# Patient Record
Sex: Female | Born: 1935 | Race: White | Hispanic: No | Smoking: Former smoker
Health system: Southern US, Community
[De-identification: ages and names within clinical notes are randomized; demographics above are authoritative.]

## PROBLEM LIST (undated history)

## (undated) DIAGNOSIS — C801 Malignant (primary) neoplasm, unspecified: Secondary | ICD-10-CM

## (undated) DIAGNOSIS — M1990 Unspecified osteoarthritis, unspecified site: Secondary | ICD-10-CM

## (undated) DIAGNOSIS — R0600 Dyspnea, unspecified: Secondary | ICD-10-CM

## (undated) DIAGNOSIS — K219 Gastro-esophageal reflux disease without esophagitis: Secondary | ICD-10-CM

## (undated) DIAGNOSIS — D649 Anemia, unspecified: Secondary | ICD-10-CM

## (undated) DIAGNOSIS — E039 Hypothyroidism, unspecified: Secondary | ICD-10-CM

## (undated) DIAGNOSIS — I10 Essential (primary) hypertension: Secondary | ICD-10-CM

## (undated) HISTORY — PX: EYE SURGERY: SHX253

## (undated) HISTORY — PX: THYROIDECTOMY, PARTIAL: SHX18

## (undated) HISTORY — PX: JOINT REPLACEMENT: SHX530

## (undated) HISTORY — PX: TONSILLECTOMY: SUR1361

## (undated) HISTORY — PX: APPENDECTOMY: SHX54

---

## 2018-02-14 ENCOUNTER — Other Ambulatory Visit: Payer: Self-pay | Admitting: General Surgery

## 2018-02-14 ENCOUNTER — Ambulatory Visit: Payer: Self-pay | Admitting: General Surgery

## 2018-02-14 DIAGNOSIS — Z170 Estrogen receptor positive status [ER+]: Principal | ICD-10-CM

## 2018-02-14 DIAGNOSIS — C50911 Malignant neoplasm of unspecified site of right female breast: Secondary | ICD-10-CM

## 2018-02-15 ENCOUNTER — Encounter: Payer: Self-pay | Admitting: Hematology and Oncology

## 2018-02-15 ENCOUNTER — Telehealth: Payer: Self-pay | Admitting: Hematology and Oncology

## 2018-02-15 NOTE — Telephone Encounter (Signed)
A new patient appt has been scheduled for the pt to see Dr. Lindi Adie on 2/24 at 1pm. Letter mailed.

## 2018-02-16 ENCOUNTER — Encounter: Payer: Self-pay | Admitting: Hematology and Oncology

## 2018-02-16 ENCOUNTER — Telehealth: Payer: Self-pay | Admitting: Hematology and Oncology

## 2018-02-16 NOTE — Telephone Encounter (Signed)
Received a new referral from Dr. Marlou Starks for new dx of breast cancer. Pt has been cld and scheduled to see Dr. Lindi Adie on 2/25 at 1pm. Pt aware of new appt w/Dr. Isidore Moos as weel on the same day. Letter mailed.

## 2018-02-22 NOTE — Pre-Procedure Instructions (Signed)
Dana Levine  02/22/2018     Your procedure is scheduled on March 06, 2018.  Report to [facility] Admitting at 06:00 A.M.  Call this number if you have problems the morning of surgery:  [phone]   Remember:  Do not eat or drink after midnight.  You may drink clear liquids until 05:00am the morning of surgery.  Clear liquids allowed are:  Water, Juice (non-citric and without pulp), Carbonated beverages, Clear Tea, Black Coffee only, Plain Jell-O only and Gatorade.    Take these medicines the morning of surgery with A SIP OF WATER : Atenolol (Tenormin) Levothyroxine (Synthroid)  7 days prior to surgery STOP taking any Aspirin (unless otherwise instructed by your surgeon), Aleve, Naproxen, Ibuprofen, Motrin, Advil, Goody's, BC's, all herbal medications, fish oil, and all vitamins.    Do not wear jewelry, make-up or nail polish.  Do not wear lotions, powders, or perfumes, or deodorant.  Do not shave 48 hours prior to surgery.   Do not bring valuables to the hospital.  [facility] is not responsible for any belongings or valuables.  Contacts, dentures or bridgework may not be worn into surgery.  Leave your suitcase in the car.  After surgery it may be brought to your room.  For patients admitted to the hospital, discharge time will be determined by your treatment team.  Patients discharged the day of surgery will not be allowed to drive home.   Special instructions:   [facility]- Preparing For Surgery  Before surgery, you can play an important role. Because skin is not sterile, your skin needs to be as free of germs as possible. You can reduce the number of germs on your skin by washing with CHG (chlorahexidine gluconate) Soap before surgery.  CHG is an antiseptic cleaner which kills germs and bonds with the skin to continue killing germs even after washing.    Oral Hygiene is also important to reduce your risk of infection.  Remember - BRUSH YOUR TEETH THE  MORNING OF SURGERY WITH YOUR REGULAR TOOTHPASTE  Please do not use if you have an allergy to CHG or antibacterial soaps. If your skin becomes reddened/irritated stop using the CHG.  Do not shave (including legs and underarms) for at least 48 hours prior to first CHG shower. It is OK to shave your face.  Please follow these instructions carefully.   1. Shower the NIGHT BEFORE SURGERY and the MORNING OF SURGERY with CHG.   2. If you chose to wash your hair, wash your hair first as usual with your normal shampoo.  3. After you shampoo, rinse your hair and body thoroughly to remove the shampoo.  4. Use CHG as you would any other liquid soap. You can apply CHG directly to the skin and wash gently with a scrungie or a clean washcloth.   5. Apply the CHG Soap to your body ONLY FROM THE NECK DOWN.  Do not use on open wounds or open sores. Avoid contact with your eyes, ears, mouth and genitals (private parts). Wash Face and genitals (private parts)  with your normal soap.  6. Wash thoroughly, paying special attention to the area where your surgery will be performed.  7. Thoroughly rinse your body with warm water from the neck down.  8. DO NOT shower/wash with your normal soap after using and rinsing off the CHG Soap.  9. Pat yourself dry with a CLEAN TOWEL.  10. Wear CLEAN PAJAMAS to bed the night before  surgery, wear comfortable clothes the morning of surgery  11. Place CLEAN SHEETS on your bed the night of your first shower and DO NOT SLEEP WITH PETS.    Day of Surgery:  Do not apply any deodorants/lotions.  Please wear clean clothes to the hospital/surgery center.   Remember to brush your teeth WITH YOUR REGULAR TOOTHPASTE.    Please read over the following fact sheets that you were given.

## 2018-02-23 ENCOUNTER — Encounter (HOSPITAL_COMMUNITY)
Admission: RE | Admit: 2018-02-23 | Discharge: 2018-02-23 | Disposition: A | Discharge status: Home / Self Care | Payer: Medicare Other | Source: Ambulatory Visit | Attending: General Surgery | Admitting: General Surgery

## 2018-02-23 ENCOUNTER — Other Ambulatory Visit: Payer: Self-pay

## 2018-02-23 ENCOUNTER — Encounter (HOSPITAL_COMMUNITY): Payer: Self-pay

## 2018-02-23 DIAGNOSIS — Z01812 Encounter for preprocedural laboratory examination: Secondary | ICD-10-CM | POA: Diagnosis not present

## 2018-02-23 HISTORY — DX: Unspecified osteoarthritis, unspecified site: M19.90

## 2018-02-23 HISTORY — DX: Malignant (primary) neoplasm, unspecified: C80.1

## 2018-02-23 HISTORY — DX: Dyspnea, unspecified: R06.00

## 2018-02-23 HISTORY — DX: Essential (primary) hypertension: I10

## 2018-02-23 HISTORY — DX: Anemia, unspecified: D64.9

## 2018-02-23 HISTORY — DX: Gastro-esophageal reflux disease without esophagitis: K21.9

## 2018-02-23 HISTORY — DX: Hypothyroidism, unspecified: E03.9

## 2018-02-23 LAB — CBC
HCT: 45.5 % (ref 36.0–46.0)
Hemoglobin: 14.7 g/dL (ref 12.0–15.0)
MCH: 31 pg (ref 26.0–34.0)
MCHC: 32.3 g/dL (ref 30.0–36.0)
MCV: 96 fL (ref 80.0–100.0)
Platelets: 248 10*3/uL (ref 150–400)
RBC: 4.74 MIL/uL (ref 3.87–5.11)
RDW: 12.3 % (ref 11.5–15.5)
WBC: 7.3 10*3/uL (ref 4.0–10.5)
nRBC: 0 % (ref 0.0–0.2)

## 2018-02-23 LAB — BASIC METABOLIC PANEL
ANION GAP: 11 (ref 5–15)
BUN: 22 mg/dL (ref 8–23)
CO2: 26 mmol/L (ref 22–32)
Calcium: 9.9 mg/dL (ref 8.9–10.3)
Chloride: 102 mmol/L (ref 98–111)
Creatinine, Ser: 0.9 mg/dL (ref 0.44–1.00)
GFR calc Af Amer: >60 mL/min (ref >60)
GFR calc non Af Amer: 58 mL/min — ABNORMAL LOW (ref >60)
Glucose, Bld: 101 mg/dL — ABNORMAL HIGH (ref 70–99)
Potassium: 3.9 mmol/L (ref 3.5–5.1)
Sodium: 139 mmol/L (ref 135–145)

## 2018-02-23 NOTE — Progress Notes (Signed)
PCP - Natalia Leatherwood, PA Cardiologist - Dr. Shirlee More  Chest x-ray - n/a EKG - 11/18/2017, requested tracing Stress Test - 10/2017; requested results ECHO - 10/2017; requested results Cardiac Cath - n/a  Sleep Study - n/a CPAP - n/a  Fasting Blood Sugar - n/a Checks Blood Sugar _____ times a day  Blood Thinner Instructions: n/a Aspirin Instructions: n/a   Anesthesia review: recent cardiac testing, results requested  Patient denies shortness of breath, fever, cough and chest pain at PAT appointment   Patient verbalized understanding of instructions that were given to them at the PAT appointment. Patient was also instructed that they will need to review over the PAT instructions again at home before surgery.

## 2018-02-26 NOTE — Progress Notes (Addendum)
Anesthesia Chart Review:  Case:  [id number] Date/Time:  03/06/18 0745   Procedure:  RIGHT BREAST LUMPECTOMY WITH RADIOACTIVE SEED LOCALIZATION (Right Breast)   Anesthesia type:  General   Pre-op diagnosis:  RIGHT BREAST CANCER   Location:  [facility] OR ROOM 09 / [place] OR   Surgeon:  Jovita Kussmaul, MD     Radioactive seed implant scheduled for 03/03/18.   DISCUSSION: Patient is an 85 year old female scheduled for the above procedure.  History includes former smoker (quit 1970), HTN, hypothyroidism, anemia, GERD, right breast cancer, exertional dyspnea (work-up 10/2017, see below). Care Everywhere notes also mention history of "blood clots in her legs" after 2013 hip surgery.  She was seen by cardiologist Dr. Zenia Resides in 10/2017 for evaluation of dyspnea (see [place]). He reviewed 09/2017 echo results (EF 65%, mild MR, moderate TR, moderate pulmonary hypertension). CTA chest and stress echo ordered (see below). According to follow-up visit, D-dimer was elevated, but CTA showed no PE or other acute findings and stress echo showed normal LVEF without ischemic changes. No additional testing ordered.  She did report occasional "slight skipped beat/palpitations" that were short-lived and sometimes at night that were not particularly bothersome, but denied chest pain, dizziness, syncope. Taking atenolol at bedtime suggested. According to 02/21/18 cardiology visit with Chinita Greenland, NP patient was "currently stable from a cardiac perspective."  Based on currently available information, I would anticipate that she can proceed as planned if no acute changes.   VS: BP (!) 143/69 Comment: rechecked  Pulse 72   Temp 36.4 C   Resp 18   Ht 5\' 6"  (1.676 m)   Wt 65.5 kg   SpO2 96%   BMI 23.32 kg/m     PROVIDERS: - PCP is with [facility]. Last visit 12/01/17 for Medicare Wellness Exam by Natalia Leatherwood, PA-C - Celine Ahr, MD is cardiologist ([place]). Last visit  02/21/18 with Chinita Greenland, NP for follow-up HTN and dyspnea. Recent CTA and stress echo reassuring. Continue current antihypertensive regimen recommended as well as heart healthy diet and routine exercise.    LABS: Labs reviewed: Acceptable for surgery. (all labs ordered are listed, but only abnormal results are displayed)  Labs Reviewed  BASIC METABOLIC PANEL - Abnormal; Notable for the following components:      Result Value   Glucose, Bld 101 (*)    GFR calc non Af Amer 58 (*)    All other components within normal limits  CBC     IMAGES: CTA Chest 11/21/17 [facility]): Impression: 1.  No evidence of pulmonary embolism. 2.  Extensive atherosclerotic calcifications noted throughout the thoracic aorta.  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  The great vessel origins are widely patent.  The aorta measures 2.5 cm in maximum transverse dimension.  The descending thoracic aorta measures 2.1 cm and at the level of the diaphragm the aorta measures 1.6 cm. No evidence of aortic aneurysm or dissection. The root of the abdominal aorta measures a maximum of 3 cm in AP dimension.   EKG: 11/18/17 ([facility]): Sinus bradycardia at 54 bpm. Right BBB.   CV: Stress echo 11/21/17 ([facility]): Summary: No regional wall motion abnormality at rest or with post exercise views of the left ventricle.  No myocardial ischemia noted at a workload of 7 METS.  LVEF 70 to 75%. - Echo portion: Left  ventricle is normal in size.  There is no thrombus.  There is normal left ventricular wall thickness.  The left ventricular ejection fraction is normal at 70-75%.  The left ventricular wall motion is normal.  The E/A ratio is consistent with normal flow pattern. The right ventricle is mildly dilated.  The left and right atria are mildly dilated.  The mitral valve leaflets  appear normal.  There is no evidence of stenosis, fluttering, or prolapse. There is no mitral regurgitation.  The tricuspid valve leaflets are thin and pliable and the valve motion is normal.  The aortic valve opens well.  There is no aortic stenosis.  There is no aortic regurgitation.  The pulmonic valve is not well-visualized.  The aortic root is normal in diameter.  There is no pericardial effusion..  Echo 10/21/17 [facility]): Impression: 1. Preserved systolic function of 07%.  2. Trace aortic insufficiency.  3. Mild mitral regurgitation. 4. Moderate tricuspid regurgitation. 5. Moderate pulmonary hypertension. RVSP 50 mmHg. 6. Mild pulmonic insufficiency.    Past Medical History:  Diagnosis Date  . Anemia   . Arthritis   . Cancer [facility])    Right Breast Cancer  . Dyspnea    with exertion  . GERD (gastroesophageal reflux disease)   . Hypertension   . Hypothyroidism     Past Surgical History:  Procedure Laterality Date  . APPENDECTOMY    . EYE SURGERY Bilateral    cataracts removed  . JOINT REPLACEMENT Right    hip   . THYROIDECTOMY, PARTIAL    . TONSILLECTOMY      MEDICATIONS: . atenolol (TENORMIN) 50 MG tablet  . Coenzyme Q10 (CO Q10) 100 MG CAPS  . hydrochlorothiazide (HYDRODIURIL) 25 MG tablet  . levothyroxine (SYNTHROID, LEVOTHROID) 75 MCG tablet  . losartan (COZAAR) 25 MG tablet  . Multiple Vitamin (MULTIVITAMIN WITH MINERALS) TABS tablet  . Omega-3 Fatty Acids (FISH OIL) 1200 MG CAPS  . TURMERIC PO  . Vitamin D-Vitamin K (VITAMIN K2-VITAMIN D3 PO)   No current facility-administered medications for this encounter.     Myra Gianotti, PA-C Surgical Short Stay/Anesthesiology [facility] Phone [phone] [facility] Phone [phone] 02/27/2018 4:18 PM

## 2018-02-27 NOTE — Anesthesia Preprocedure Evaluation (Addendum)
Anesthesia Evaluation  Patient identified by MRN, date of birth, ID band Patient awake    Reviewed: Allergy & Precautions, NPO status , Patient's Chart, lab work & pertinent test results  Airway Mallampati: II  TM Distance: <3 FB Neck ROM: Full    Dental no notable dental hx. (+) Teeth Intact, Dental Advisory Given   Pulmonary shortness of breath, former smoker,    Pulmonary exam normal breath sounds clear to auscultation       Cardiovascular Exercise Tolerance: Good hypertension, Pt. on medications Normal cardiovascular exam Rhythm:Regular Rate:Normal  Echo 11/21/17  Stress echo 11/21/17 ([facility]): Summary: No regional wall motion abnormality at rest or with post exercise views of the left ventricle.  No myocardial ischemia noted at a workload of 7 METS.  LVEF 70 to 75%.   Neuro/Psych negative neurological ROS  negative psych ROS   GI/Hepatic GERD  ,  Endo/Other  Hypothyroidism   Renal/GU      Musculoskeletal  (+) Arthritis ,   Abdominal   Peds  Hematology Hgb 14.7   Anesthesia Other Findings   Reproductive/Obstetrics                          Anesthesia Physical Anesthesia Plan  ASA: III  Anesthesia Plan: General   Post-op Pain Management:    Induction: Intravenous  PONV Risk Score and Plan: 3 and Treatment may vary due to age or medical condition, Ondansetron and Dexamethasone  Airway Management Planned: LMA  Additional Equipment:   Intra-op Plan:   Post-operative Plan:   Informed Consent: I have reviewed the patients History and Physical, chart, labs and discussed the procedure including the risks, benefits and alternatives for the proposed anesthesia with the patient or authorized representative who has indicated his/her understanding and acceptance.     Dental advisory given  Plan Discussed with:   Anesthesia Plan Comments: (PAT note written  02/27/2018 by Myra Gianotti, PA-C. )       Anesthesia Quick Evaluation

## 2018-03-03 ENCOUNTER — Ambulatory Visit
Admission: RE | Admit: 2018-03-03 | Discharge: 2018-03-03 | Disposition: A | Discharge status: Home / Self Care | Payer: Medicare Other | Source: Ambulatory Visit | Attending: General Surgery | Admitting: General Surgery

## 2018-03-03 DIAGNOSIS — C50911 Malignant neoplasm of unspecified site of right female breast: Secondary | ICD-10-CM

## 2018-03-03 DIAGNOSIS — Z170 Estrogen receptor positive status [ER+]: Principal | ICD-10-CM

## 2018-03-06 ENCOUNTER — Encounter (HOSPITAL_COMMUNITY)
Admission: RE | Disposition: A | Discharge status: Home / Self Care | Payer: Self-pay | Source: Ambulatory Visit | Attending: General Surgery

## 2018-03-06 ENCOUNTER — Ambulatory Visit (HOSPITAL_COMMUNITY): Payer: Medicare Other | Admitting: Anesthesiology

## 2018-03-06 ENCOUNTER — Ambulatory Visit (HOSPITAL_COMMUNITY): Payer: Medicare Other | Admitting: Vascular Surgery

## 2018-03-06 ENCOUNTER — Encounter (HOSPITAL_COMMUNITY): Payer: Self-pay | Admitting: Surgery

## 2018-03-06 ENCOUNTER — Ambulatory Visit
Admission: RE | Admit: 2018-03-06 | Discharge: 2018-03-06 | Disposition: A | Discharge status: Home / Self Care | Payer: Medicare Other | Source: Ambulatory Visit | Attending: General Surgery | Admitting: General Surgery

## 2018-03-06 ENCOUNTER — Ambulatory Visit (HOSPITAL_COMMUNITY)
Admission: RE | Admit: 2018-03-06 | Discharge: 2018-03-06 | Disposition: A | Discharge status: Home / Self Care | Payer: Medicare Other | Source: Ambulatory Visit | Attending: General Surgery | Admitting: General Surgery

## 2018-03-06 ENCOUNTER — Other Ambulatory Visit: Payer: Self-pay

## 2018-03-06 DIAGNOSIS — D0501 Lobular carcinoma in situ of right breast: Secondary | ICD-10-CM | POA: Diagnosis not present

## 2018-03-06 DIAGNOSIS — E039 Hypothyroidism, unspecified: Secondary | ICD-10-CM | POA: Diagnosis not present

## 2018-03-06 DIAGNOSIS — Z170 Estrogen receptor positive status [ER+]: Principal | ICD-10-CM

## 2018-03-06 DIAGNOSIS — Z79890 Hormone replacement therapy (postmenopausal): Secondary | ICD-10-CM | POA: Diagnosis not present

## 2018-03-06 DIAGNOSIS — D0511 Intraductal carcinoma in situ of right breast: Secondary | ICD-10-CM | POA: Diagnosis not present

## 2018-03-06 DIAGNOSIS — Z86711 Personal history of pulmonary embolism: Secondary | ICD-10-CM | POA: Diagnosis not present

## 2018-03-06 DIAGNOSIS — I10 Essential (primary) hypertension: Secondary | ICD-10-CM | POA: Diagnosis not present

## 2018-03-06 DIAGNOSIS — C50111 Malignant neoplasm of central portion of right female breast: Secondary | ICD-10-CM | POA: Diagnosis present

## 2018-03-06 DIAGNOSIS — C50911 Malignant neoplasm of unspecified site of right female breast: Secondary | ICD-10-CM

## 2018-03-06 DIAGNOSIS — Z87891 Personal history of nicotine dependence: Secondary | ICD-10-CM | POA: Insufficient documentation

## 2018-03-06 DIAGNOSIS — Z800 Family history of malignant neoplasm of digestive organs: Secondary | ICD-10-CM | POA: Diagnosis not present

## 2018-03-06 DIAGNOSIS — Z79899 Other long term (current) drug therapy: Secondary | ICD-10-CM | POA: Diagnosis not present

## 2018-03-06 HISTORY — PX: BREAST LUMPECTOMY WITH RADIOACTIVE SEED LOCALIZATION: SHX6424

## 2018-03-06 SURGERY — BREAST LUMPECTOMY WITH RADIOACTIVE SEED LOCALIZATION
Anesthesia: General | Site: Breast | Laterality: Right

## 2018-03-06 MED ORDER — HYDROCODONE-ACETAMINOPHEN 5-325 MG PO TABS: 1.0000 | ORAL_TABLET | Freq: Four times a day (QID) | ORAL | 0 refills | Status: DC | PRN

## 2018-03-06 MED ORDER — KETOROLAC TROMETHAMINE 15 MG/ML IJ SOLN: 15.0000 mg | Freq: Once | INTRAMUSCULAR | Status: DC | PRN

## 2018-03-06 MED ORDER — CHLORHEXIDINE GLUCONATE CLOTH 2 % EX PADS: 6.0000 | MEDICATED_PAD | Freq: Once | CUTANEOUS | Status: DC

## 2018-03-06 MED ORDER — DEXAMETHASONE SODIUM PHOSPHATE 10 MG/ML IJ SOLN
INTRAMUSCULAR | Status: DC | PRN
  Administered 2018-03-06: 4 mg via INTRAVENOUS

## 2018-03-06 MED ORDER — FENTANYL CITRATE (PF) 250 MCG/5ML IJ SOLN
INTRAMUSCULAR | Status: AC
  Filled 2018-03-06: qty 5

## 2018-03-06 MED ORDER — ONDANSETRON HCL 4 MG/2ML IJ SOLN
INTRAMUSCULAR | Status: DC | PRN
  Administered 2018-03-06: 4 mg via INTRAVENOUS

## 2018-03-06 MED ORDER — EPHEDRINE 5 MG/ML INJ
INTRAVENOUS | Status: AC
  Filled 2018-03-06: qty 10

## 2018-03-06 MED ORDER — CEFAZOLIN SODIUM-DEXTROSE 2-4 GM/100ML-% IV SOLN
2.0000 g | INTRAVENOUS | Status: AC
  Administered 2018-03-06: 2 g via INTRAVENOUS
  Filled 2018-03-06: qty 100

## 2018-03-06 MED ORDER — ONDANSETRON HCL 4 MG/2ML IJ SOLN
INTRAMUSCULAR | Status: AC
  Filled 2018-03-06: qty 2

## 2018-03-06 MED ORDER — LIDOCAINE 2% (20 MG/ML) 5 ML SYRINGE
INTRAMUSCULAR | Status: DC | PRN
  Administered 2018-03-06: 80 mg via INTRAVENOUS

## 2018-03-06 MED ORDER — PROPOFOL 10 MG/ML IV BOLUS
INTRAVENOUS | Status: AC
  Filled 2018-03-06: qty 40

## 2018-03-06 MED ORDER — LACTATED RINGERS IV SOLN
INTRAVENOUS | Status: DC
  Administered 2018-03-06: 07:00:00 via INTRAVENOUS

## 2018-03-06 MED ORDER — 0.9 % SODIUM CHLORIDE (POUR BTL) OPTIME
TOPICAL | Status: DC | PRN
  Administered 2018-03-06: 1000 mL

## 2018-03-06 MED ORDER — FENTANYL CITRATE (PF) 100 MCG/2ML IJ SOLN
INTRAMUSCULAR | Status: DC | PRN
  Administered 2018-03-06: 50 ug via INTRAVENOUS

## 2018-03-06 MED ORDER — EPHEDRINE SULFATE 50 MG/ML IJ SOLN
INTRAMUSCULAR | Status: DC | PRN
  Administered 2018-03-06: 10 mg via INTRAVENOUS
  Administered 2018-03-06 (×2): 5 mg via INTRAVENOUS
  Administered 2018-03-06 (×2): 10 mg via INTRAVENOUS

## 2018-03-06 MED ORDER — FENTANYL CITRATE (PF) 100 MCG/2ML IJ SOLN: 25.0000 ug | INTRAMUSCULAR | Status: DC | PRN

## 2018-03-06 MED ORDER — LIDOCAINE 2% (20 MG/ML) 5 ML SYRINGE
INTRAMUSCULAR | Status: AC
  Filled 2018-03-06: qty 5

## 2018-03-06 MED ORDER — GABAPENTIN 300 MG PO CAPS
300.0000 mg | ORAL_CAPSULE | ORAL | Status: AC
  Administered 2018-03-06: 300 mg via ORAL
  Filled 2018-03-06: qty 1

## 2018-03-06 MED ORDER — SUCCINYLCHOLINE CHLORIDE 200 MG/10ML IV SOSY
PREFILLED_SYRINGE | INTRAVENOUS | Status: AC
  Filled 2018-03-06: qty 10

## 2018-03-06 MED ORDER — BUPIVACAINE HCL (PF) 0.25 % IJ SOLN
INTRAMUSCULAR | Status: DC | PRN
  Administered 2018-03-06: 20 mL

## 2018-03-06 MED ORDER — DEXAMETHASONE SODIUM PHOSPHATE 10 MG/ML IJ SOLN
INTRAMUSCULAR | Status: AC
  Filled 2018-03-06: qty 1

## 2018-03-06 MED ORDER — ONDANSETRON HCL 4 MG/2ML IJ SOLN: 4.0000 mg | Freq: Once | INTRAMUSCULAR | Status: DC | PRN

## 2018-03-06 MED ORDER — PHENYLEPHRINE 40 MCG/ML (10ML) SYRINGE FOR IV PUSH (FOR BLOOD PRESSURE SUPPORT)
PREFILLED_SYRINGE | INTRAVENOUS | Status: AC
  Filled 2018-03-06: qty 10

## 2018-03-06 MED ORDER — ACETAMINOPHEN 500 MG PO TABS
1000.0000 mg | ORAL_TABLET | ORAL | Status: AC
  Administered 2018-03-06: 1000 mg via ORAL
  Filled 2018-03-06: qty 2

## 2018-03-06 MED ORDER — PROPOFOL 10 MG/ML IV BOLUS
INTRAVENOUS | Status: DC | PRN
  Administered 2018-03-06: 80 mg via INTRAVENOUS

## 2018-03-06 MED ORDER — CELECOXIB 200 MG PO CAPS
200.0000 mg | ORAL_CAPSULE | ORAL | Status: AC
  Administered 2018-03-06: 200 mg via ORAL
  Filled 2018-03-06: qty 1

## 2018-03-06 MED ORDER — BUPIVACAINE HCL (PF) 0.25 % IJ SOLN
INTRAMUSCULAR | Status: AC
  Filled 2018-03-06: qty 30

## 2018-03-06 SURGICAL SUPPLY — 31 items
APPLIER CLIP 9.375 MED OPEN (MISCELLANEOUS)
BINDER BREAST LRG (GAUZE/BANDAGES/DRESSINGS) IMPLANT
BINDER BREAST XLRG (GAUZE/BANDAGES/DRESSINGS) IMPLANT
CANISTER SUCT 3000ML PPV (MISCELLANEOUS) ×3 IMPLANT
CHLORAPREP W/TINT 26ML (MISCELLANEOUS) ×3 IMPLANT
CLIP APPLIE 9.375 MED OPEN (MISCELLANEOUS) IMPLANT
COVER PROBE W GEL 5X96 (DRAPES) ×3 IMPLANT
COVER SURGICAL LIGHT HANDLE (MISCELLANEOUS) ×3 IMPLANT
COVER WAND RF STERILE (DRAPES) IMPLANT
DERMABOND ADVANCED (GAUZE/BANDAGES/DRESSINGS) ×2
DERMABOND ADVANCED .7 DNX12 (GAUZE/BANDAGES/DRESSINGS) ×1 IMPLANT
DEVICE DUBIN SPECIMEN MAMMOGRA (MISCELLANEOUS) ×3 IMPLANT
DRAPE CHEST BREAST 15X10 FENES (DRAPES) ×3 IMPLANT
ELECT COATED BLADE 2.86 ST (ELECTRODE) ×3 IMPLANT
ELECT REM PT RETURN 9FT ADLT (ELECTROSURGICAL) ×3
ELECTRODE REM PT RTRN 9FT ADLT (ELECTROSURGICAL) ×1 IMPLANT
GLOVE BIO SURGEON STRL SZ7.5 (GLOVE) ×6 IMPLANT
GOWN STRL REUS W/ TWL LRG LVL3 (GOWN DISPOSABLE) ×2 IMPLANT
GOWN STRL REUS W/TWL LRG LVL3 (GOWN DISPOSABLE) ×4
KIT BASIN OR (CUSTOM PROCEDURE TRAY) ×3 IMPLANT
KIT MARKER MARGIN INK (KITS) ×3 IMPLANT
LIGHT WAVEGUIDE WIDE FLAT (MISCELLANEOUS) IMPLANT
NEEDLE HYPO 25GX1X1/2 BEV (NEEDLE) ×3 IMPLANT
NS IRRIG 1000ML POUR BTL (IV SOLUTION) ×3 IMPLANT
PACK GENERAL/GYN (CUSTOM PROCEDURE TRAY) ×3 IMPLANT
SUT MNCRL AB 4-0 PS2 18 (SUTURE) ×3 IMPLANT
SUT SILK 2 0 SH (SUTURE) IMPLANT
SUT VIC AB 3-0 SH 18 (SUTURE) ×3 IMPLANT
SYR CONTROL 10ML LL (SYRINGE) ×3 IMPLANT
TOWEL OR 17X24 6PK STRL BLUE (TOWEL DISPOSABLE) ×3 IMPLANT
TOWEL OR 17X26 10 PK STRL BLUE (TOWEL DISPOSABLE) ×3 IMPLANT

## 2018-03-06 NOTE — Op Note (Signed)
03/06/2018  9:11 AM  PATIENT:  Dana Levine  85 y.o. female  PRE-OPERATIVE DIAGNOSIS:  RIGHT BREAST CANCER  POST-OPERATIVE DIAGNOSIS:  RIGHT BREAST CANCER  PROCEDURE:  Procedure(s): RIGHT BREAST LUMPECTOMY WITH RADIOACTIVE SEED LOCALIZATION (Right)  SURGEON:  Surgeon(s) and Role:    * Jovita Kussmaul, MD - Primary  PHYSICIAN ASSISTANT:   ASSISTANTS: none   ANESTHESIA:   local and general  EBL:  10 mL   BLOOD ADMINISTERED:none  DRAINS: none   LOCAL MEDICATIONS USED:  MARCAINE     SPECIMEN:  Source of Specimen:  right breast tissue with additional superior and lateral margins  DISPOSITION OF SPECIMEN:  PATHOLOGY  COUNTS:  YES  TOURNIQUET:  * No tourniquets in log *  DICTATION: .Dragon Dictation   After informed consent was obtained the patient was brought to the operating room and placed in the supine position on the operating table.  After adequate induction of general anesthesia the patient's right breast was prepped with ChloraPrep, allowed to dry, and draped in usual sterile manner.  An appropriate timeout was performed.  Previously an I-125 seed was placed in the upper inner portion of the right breast to mark an area of invasive breast cancer.  The neoprobe was set to I-125 in the area of radioactivity was readily identified centrally in the upper outer quadrant of the right breast.  The area around this was infiltrated with quarter percent Marcaine.  A small curvilinear incision was made along the upper inner edge of the areola of the right breast.  The incision was carried through the skin and subcutaneous tissue sharply with the electrocautery.  Dissection was then carried towards the radioactive seed sharply with the electrocautery under the direction of the neoprobe.  Once I more closely approached the radioactive seed I then removed a circular portion of breast tissue sharply around the radioactive seed while checking the area of radioactivity frequently.  During  the dissection I did encounter the seed along the upper edge of the specimen.  The seed was imaged and sent separately to pathology.  Once the specimen was removed it was oriented with the appropriate paint colors.  A specimen radiograph showed the clip to be near the upper edge of the specimen.  Because of this I decided to take an additional superior and lateral margins and these were marked appropriately.  Hemostasis was achieved using the Bovie electrocautery.  The wound was then irrigated with saline and infiltrated with more quarter percent Marcaine.  The cavity was marked with clips.  The deep layer of the wound was then closed with layers of interrupted 3-0 Vicryl stitches.  The skin was then closed with interrupted 4-0 Monocryl subcuticular stitches.  Dermabond dressings were applied.  The patient tolerated the procedure well.  At the end of the case all needle sponge and instrument counts were correct.  The patient was then awakened and taken recovery in stable condition.  PLAN OF CARE: Discharge to home after PACU  PATIENT DISPOSITION:  PACU - hemodynamically stable.   Delay start of Pharmacological VTE agent (>24hrs) due to surgical blood loss or risk of bleeding: not applicable

## 2018-03-06 NOTE — Transfer of Care (Signed)
Immediate Anesthesia Transfer of Care Note  Patient: Dana Levine  Procedure(s) Performed: RIGHT BREAST LUMPECTOMY WITH RADIOACTIVE SEED LOCALIZATION (Right Breast)  Patient Location: PACU  Anesthesia Type:General  Level of Consciousness: awake, drowsy and patient cooperative  Airway & Oxygen Therapy: Patient Spontanous Breathing and Patient connected to nasal cannula oxygen  Post-op Assessment: Report given to RN, Post -op Vital signs reviewed and stable and Patient moving all extremities  Post vital signs: Reviewed and stable  Last Vitals:  Vitals Value Taken Time  BP    Temp    Pulse    Resp    SpO2      Last Pain:  Vitals:   03/06/18 0654  TempSrc:   PainSc: 0-No pain      Patients Stated Pain Goal: 3 (13/68/59 9234)  Complications: No apparent anesthesia complications

## 2018-03-06 NOTE — Interval H&P Note (Signed)
History and Physical Interval Note:  03/06/2018 7:59 AM  Dana Levine  has presented today for surgery, with the diagnosis of RIGHT BREAST CANCER  The various methods of treatment have been discussed with the patient and family. After consideration of risks, benefits and other options for treatment, the patient has consented to  Procedure(s): RIGHT BREAST LUMPECTOMY WITH RADIOACTIVE SEED LOCALIZATION (Right) as a surgical intervention .  The patient's history has been reviewed, patient examined, no change in status, stable for surgery.  I have reviewed the patient's chart and labs.  Questions were answered to the patient's satisfaction.     Autumn Messing III

## 2018-03-06 NOTE — Anesthesia Procedure Notes (Signed)
Procedure Name: LMA Insertion Date/Time: 03/06/2018 8:21 AM Performed by: Lowella Dell, CRNA Pre-anesthesia Checklist: Patient identified, Emergency Drugs available, Suction available and Patient being monitored Patient Re-evaluated:Patient Re-evaluated prior to induction Oxygen Delivery Method: Circle System Utilized Preoxygenation: Pre-oxygenation with 100% oxygen Induction Type: IV induction Ventilation: Mask ventilation without difficulty LMA: LMA inserted LMA Size: 4.0 Number of attempts: 1 Placement Confirmation: positive ETCO2 and breath sounds checked- equal and bilateral Tube secured with: Tape Dental Injury: Teeth and Oropharynx as per pre-operative assessment

## 2018-03-06 NOTE — H&P (Signed)
Dana Levine  Location: [facility] Surgery Patient #: [id number] DOB: 1929-12-25 Married / Language: Undefined / Race: Refused to Report/Unreported Female   History of Present Illness  The patient is a 85 year old female who presents with breast cancer. We are asked to see the patient in consultation by Dr. Carmin Richmond to evaluate her for a new right breast cancer. The patient is an 85 year old white female who recently went for a routine screening mammogram. At that time she was found to have a 9 mm mass in the central portion of the right breast. This was biopsied and came back as a grade 2 invasive ductal cancer that was ER/PR positive and likely HER-2 negative. She denies any breast pain or discharge from the nipple. She denies any family history of breast cancer. Her father did have colon cancer. She does not smoke.   Past Surgical History  Appendectomy  Breast Biopsy  Right. Cataract Surgery  Bilateral. Colon Polyp Removal - Colonoscopy  Hip Surgery  Right. Oral Surgery  Thyroid Surgery  Tonsillectomy   Diagnostic Studies History  Colonoscopy  5-10 years ago Mammogram  within last year Pap Smear  >5 years ago  Allergies No Known Drug Allergies  Medication History  Atenolol (50MG Tablet, Oral) Active. hydroCHLOROthiazide (25MG Tablet, Oral) Active. Levothyroxine Sodium (75MCG Tablet, Oral) Active. Losartan Potassium (25MG Tablet, Oral) Active. Medications Reconciled  Social History  Alcohol use  Occasional alcohol use. Caffeine use  Coffee, Tea. No drug use  Tobacco use  Former smoker.  Family History Colon Cancer  Father. Malignant Neoplasm Of Pancreas  Daughter. Migraine Headache  Daughter.  Pregnancy / Birth History Age at menarche  24 years. Age of menopause  75-62 Contraceptive History  Oral contraceptives. Gravida  1 Maternal age  56-30 Para  1  Other Problems  Arthritis  Back Pain  Gastroesophageal  Reflux Disease  High blood pressure  Pulmonary Embolism / Blood Clot in Legs  Thyroid Disease     Review of Systems  HEENT Present- Hearing Loss, Seasonal Allergies and Wears glasses/contact lenses. Not Present- Earache, Hoarseness, Nose Bleed, Oral Ulcers, Ringing in the Ears, Sinus Pain, Sore Throat, Visual Disturbances and Yellow Eyes. Breast Present- Breast Mass. Not Present- Breast Pain, Nipple Discharge and Skin Changes. Cardiovascular Present- Shortness of Breath. Not Present- Chest Pain, Difficulty Breathing Lying Down, Leg Cramps, Palpitations, Rapid Heart Rate and Swelling of Extremities. Gastrointestinal Present- Hemorrhoids. Not Present- Abdominal Pain, Bloating, Bloody Stool, Change in Bowel Habits, Chronic diarrhea, Constipation, Difficulty Swallowing, Excessive gas, Gets full quickly at meals, Indigestion, Nausea, Rectal Pain and Vomiting. Female Genitourinary Present- Urgency. Not Present- Frequency, Nocturia, Painful Urination and Pelvic Pain. Musculoskeletal Present- Back Pain, Joint Pain and Swelling of Extremities. Not Present- Joint Stiffness, Muscle Pain and Muscle Weakness. Psychiatric Not Present- Anxiety, Bipolar, Change in Sleep Pattern, Depression, Fearful and Frequent crying. Endocrine Not Present- Cold Intolerance, Excessive Hunger, Hair Changes, Heat Intolerance, Hot flashes and New Diabetes. Hematology Not Present- Blood Thinners, Easy Bruising, Excessive bleeding, Gland problems, HIV and Persistent Infections.  Vitals  Weight: 143.5 lb Height: 64in Body Surface Area: 1.7 m Body Mass Index: 24.63 kg/m  BP: 144/88 (Sitting, Left Arm, Standard)       Physical Exam  General Mental Status-Alert. General Appearance-Consistent with stated age. Hydration-Well hydrated. Voice-Normal.  Head and Neck Head-normocephalic, atraumatic with no lesions or palpable masses. Trachea-midline. Thyroid Gland Characteristics - normal size and  consistency.  Eye Eyeball - Bilateral-Extraocular movements intact. Sclera/Conjunctiva - Bilateral-No scleral  icterus.  Chest and Lung Exam Chest and lung exam reveals -quiet, even and easy respiratory effort with no use of accessory muscles and on auscultation, normal breath sounds, no adventitious sounds and normal vocal resonance. Inspection Chest Wall - Normal. Back - normal.  Breast Note: There is no palpable mass in either breast. There is a small mobile palpable lymph node in the left axilla. There is no palpable axillary, supraclavicular, or cervical lymphadenopathy on the right.   Cardiovascular Cardiovascular examination reveals -normal heart sounds, regular rate and rhythm with no murmurs and normal pedal pulses bilaterally.  Abdomen Inspection Inspection of the abdomen reveals - No Hernias. Skin - Scar - no surgical scars. Palpation/Percussion Palpation and Percussion of the abdomen reveal - Soft, Non Tender, No Rebound tenderness, No Rigidity (guarding) and No hepatosplenomegaly. Auscultation Auscultation of the abdomen reveals - Bowel sounds normal.  Neurologic Neurologic evaluation reveals -alert and oriented x 3 with no impairment of recent or remote memory. Mental Status-Normal.  Musculoskeletal Normal Exam - Left-Upper Extremity Strength Normal and Lower Extremity Strength Normal. Normal Exam - Right-Upper Extremity Strength Normal and Lower Extremity Strength Normal.  Lymphatic Head & Neck  General Head & Neck Lymphatics: Bilateral - Description - Normal. Axillary  General Axillary Region: Bilateral - Description - Normal. Tenderness - Non Tender. Femoral & Inguinal  Generalized Femoral & Inguinal Lymphatics: Bilateral - Description - Normal. Tenderness - Non Tender.    Assessment & Plan  MALIGNANT NEOPLASM OF CENTRAL PORTION OF RIGHT BREAST IN FEMALE, ESTROGEN RECEPTOR POSITIVE (C50.111) Impression: The patient appears to have a  small stage I cancer in the central portion of the right breast. I have talked to her in detail about the different options for treatment and at this point she favors breast conservation. I think this is a very reasonable way of treating her cancer. Given her age I do not think she will need a node evaluation. I will plan for a right breast radioactive seed localized lumpectomy. I will also make a referral stay to medical and radiation oncology so they can discuss adjuvant therapy. Current Plans Referred to Oncology, for evaluation and follow up (Oncology). Routine.

## 2018-03-06 NOTE — Anesthesia Postprocedure Evaluation (Signed)
Anesthesia Post Note  Patient: Dana Levine  Procedure(s) Performed: RIGHT BREAST LUMPECTOMY WITH RADIOACTIVE SEED LOCALIZATION (Right Breast)     Patient location during evaluation: PACU Anesthesia Type: General Level of consciousness: awake and alert Pain management: pain level controlled Vital Signs Assessment: post-procedure vital signs reviewed and stable Respiratory status: spontaneous breathing, nonlabored ventilation, respiratory function stable and patient connected to nasal cannula oxygen Cardiovascular status: blood pressure returned to baseline and stable Postop Assessment: no apparent nausea or vomiting Anesthetic complications: no    Last Vitals:  Vitals:   03/06/18 0954 03/06/18 0959  BP: 136/71 (!) 153/68  Pulse: (!) 50 (!) 55  Resp: 12 14  Temp:    SpO2: 99% 100%    Last Pain:  Vitals:   03/06/18 0917  TempSrc:   PainSc: [place] A Kemper Heupel

## 2018-03-07 ENCOUNTER — Encounter (HOSPITAL_COMMUNITY): Payer: Self-pay | Admitting: General Surgery

## 2018-03-14 NOTE — Progress Notes (Addendum)
Location of Breast Cancer: Right Breast  Histology per Pathology Report:  01/24/18 Biopsy completed Per Dr. Ethlyn Gallery note ER/PR +, likely Her 2 NEG  03/06/18 Diagnosis 1. Breast, lumpectomy, Right - DUCTAL CARCINOMA IN SITU, INTERMEDIATE NUCLEAR GRADE. SEE NOTE. - LOBULAR CARCINOMA IN SITU. - BACKGROUND BREAST PARENCHYMA WITH FIBROCYSTIC CHANGE, INCLUDING SCLEROSING ADENOSIS AND CALCIFICATIONS. - PREVIOUS PROCEDURE-RELATED CHANGES. - SEE ONCOLOGY TABLE. 2. Medical device, removal - GROSS DIAGNOSIS ONLY: MEDICAL DEVICE CONSISTENT WITH LOCALIZATION SEED. 3. Breast, excision, Right superior - BENIGN BREAST PARENCHYMA WITH NO SPECIFIC HISTOPATHOLOGIC CHANGES. - NEGATIVE FOR IN SITU OR INVASIVE DUCTAL CARCINOMA. 4. Breast, excision, Right lateral margin - LOBULAR CARCINOMA IN SITU. - NEGATIVE FOR IN SITU OR INVASIVE DUCTAL CARCINOMA.   Did patient present with symptoms or was this found on screening mammography?: It was found on a screening mammogram.   Past/Anticipated interventions by surgeon, if any: 03/06/18 PROCEDURE:  Procedure(s): RIGHT BREAST LUMPECTOMY WITH RADIOACTIVE SEED LOCALIZATION (Right) SURGEON:  Surgeon(s) and Role:    Jovita Kussmaul, MD - Primary  Past/Anticipated interventions by medical oncology, if any:  Dr. Lindi Adie at 1:00 today  Lymphedema issues, if any:  She denies. She has good arm mobility.   Pain issues, if any:  She denies.   SAFETY ISSUES:  Prior radiation? No  Pacemaker/ICD? No  Possible current pregnancy? No  Is the patient on methotrexate? No  Current Complaints / other details:      BP (!) 151/70 (BP Location: Right Arm, Patient Position: Sitting)   Pulse 62   Temp 98.1 F (36.7 C) (Oral)   Resp 18   Ht 5\' 6"  (1.676 m)   Wt 143 lb 6.4 oz (65 kg)   SpO2 98%   BMI 23.15 kg/m    Wt Readings from Last 3 Encounters:  03/21/18 143 lb 6.4 oz (65 kg)  02/23/18 144 lb 8 oz (65.5 kg)   Jaeger Trueheart, Stephani Police, RN 03/14/2018,11:04  AM

## 2018-03-15 ENCOUNTER — Encounter: Payer: Self-pay | Admitting: Adult Health

## 2018-03-15 DIAGNOSIS — C50111 Malignant neoplasm of central portion of right female breast: Secondary | ICD-10-CM | POA: Insufficient documentation

## 2018-03-15 DIAGNOSIS — Z170 Estrogen receptor positive status [ER+]: Secondary | ICD-10-CM

## 2018-03-17 ENCOUNTER — Other Ambulatory Visit: Payer: Self-pay | Admitting: General Surgery

## 2018-03-17 NOTE — Progress Notes (Signed)
[facility] CONSULT NOTE  Patient Care Team: System, Pcp Not In as PCP - General  CHIEF COMPLAINTS/PURPOSE OF CONSULTATION: Newly diagnosed breast cancer  HISTORY OF PRESENTING ILLNESS:  Dana Levine 85 y.o. 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 The cancer was not palpable prior to diagnosis and was detected on a diagnostic mammogram on 12/28/17 which showed a suspicious 63m mass in the right breast. A biopsy on 01/24/18 showed grade 1 IDC with intermediate grade DCIS measuring 655min the right breast, ER 95%, PR 80%, HER2 positive. She had a right lumpectomy on 03/06/18 for which pathology showed intermediate grade DCIS with clear margins and lobular carcinoma in situ with no residual invasive carcinoma.   She presents to the clinic today with her husband and daughter. She denies any family history of breast cancer but notes colon cancer in her father and pancreatic cancer in her daughter. She met with Dr. SqIsidore Mooshis morning and is still deciding on whether or not to undergo radiation therapy and oral anti-estrogen therapy. She currently takes a Vitamin D supplement. She reviewed her medication list with me.   I reviewed her records extensively and collaborated the history with the patient.  SUMMARY OF ONCOLOGIC HISTORY:   Malignant neoplasm of central portion of right breast in female, estrogen receptor positive ([facility]  03/[facility] Surgery    Right lumpectomy: DCIS 0.3 cm cribriform type intermediate grade, LCIS, fibrocystic change, sclerosing adenosis, no evidence of invasive ductal carcinoma, margins negative, ER 95%, PR 80%, HER-2 equivocal 2+ by IHC ([facility] and not repeated on the final), T1BN0 stage Ia (based on biopsy showing invasive cancer)    03/15/2018 Initial Diagnosis    Screening mammogram detected 9 mm right breast cancer central portion  of the right breast, biopsy revealed grade 1 IDC WITH DCIS ER PR [facility] T1BN0 stage Ia clinical stage     MEDICAL HISTORY:  Past Medical History:  Diagnosis Date  . Anemia   . Arthritis   . [facility]   Right Breast Cancer  . Dyspnea    with exertion  . GERD (gastroesophageal reflux disease)   . Hypertension   . Hypothyroidism     SURGICAL HISTORY: Past Surgical History:  Procedure Laterality Date  . APPENDECTOMY    . BREAST LUMPECTOMY WITH RADIOACTIVE SEED LOCALIZATION Right 03/06/2018   Procedure: RIGHT BREAST LUMPECTOMY WITH RADIOACTIVE SEED LOCALIZATION;  Surgeon: ToJovita KussmaulMD;  [facility]: [facility];  Laterality: Right;  . EYE SURGERY Bilateral    cataracts removed  . JOINT REPLACEMENT Right    hip   . THYROIDECTOMY, PARTIAL    . TONSILLECTOMY      SOCIAL HISTORY: Social History   Socioeconomic History  . Marital status: Married    Spouse name: Not on file  . Number of children: Not on file  . Years of education: Not on file  . Highest education level: Not on file  Occupational History  . Not on file  Social Needs  . Financial resource strain: Not on file  . Food insecurity:    Worry: Not on file    Inability: Not on file  . Transportation needs:    Medical: No    Non-medical: No  Tobacco Use  . Smoking status: Former Smoker  Last attempt to quit: 1970    Years since quitting: 50.1  . Smokeless tobacco: Never Used  Substance and Sexual Activity  . Alcohol use: Yes    Alcohol/week: 1.0 standard drinks    Types: 1 Glasses of wine per week  . Drug use: Never  . Sexual activity: Not on file  Lifestyle  . Physical activity:    Days per week: Not on file    Minutes per session: Not on file  . Stress: Not on file  Relationships  . Social connections:    Talks on phone: Not on file    Gets together: Not on file    Attends religious service: Not on file    Active member  of club or organization: Not on file    Attends meetings of clubs or organizations: Not on file    Relationship status: Not on file  . Intimate partner violence:    Fear of current or ex partner: No    Emotionally abused: No    Physically abused: No    Forced sexual activity: No  Other Topics Concern  . Not on file  Social History Narrative  . Not on file    FAMILY HISTORY: Family History  Problem Relation Age of Onset  . Colon cancer Father   . Pancreatic cancer Daughter     ALLERGIES:  has No Known Allergies.  MEDICATIONS:  Current Outpatient Medications  Medication Sig Dispense Refill  . atenolol (TENORMIN) 50 MG tablet Take 50 mg by mouth daily.    . Coenzyme Q10 (CO Q10) 100 MG CAPS Take 100 mg by mouth daily.    . hydrochlorothiazide (HYDRODIURIL) 25 MG tablet Take 25 mg by mouth daily.    Marland Kitchen levothyroxine (SYNTHROID, LEVOTHROID) 75 MCG tablet Take 75 mcg by mouth daily before breakfast.    . losartan (COZAAR) 25 MG tablet Take 25 mg by mouth daily.    . Multiple Vitamin (MULTIVITAMIN WITH MINERALS) TABS tablet Take 1 tablet by mouth daily.    . Omega-3 Fatty Acids (FISH OIL) 1200 MG CAPS Take 1,200 mg by mouth daily.    . TURMERIC PO Take 1 tablet by mouth daily.    . Vitamin D-Vitamin K (VITAMIN K2-VITAMIN D3 PO) Take 1 tablet by mouth daily.    Marland Kitchen anastrozole (ARIMIDEX) 1 MG tablet Take 1 tablet (1 mg total) by mouth daily. 30 tablet 0   No current facility-administered medications for this visit.     REVIEW OF SYSTEMS:   Constitutional: Denies fevers, chills or abnormal night sweats Eyes: Denies blurriness of vision, double vision or watery eyes Ears, nose, mouth, throat, and face: Denies mucositis or sore throat Respiratory: Denies cough, dyspnea or wheezes Cardiovascular: Denies palpitation, chest discomfort or lower extremity swelling Gastrointestinal:  Denies nausea, heartburn or change in bowel habits Skin: Denies abnormal skin rashes Lymphatics: Denies  new lymphadenopathy or easy bruising Neurological:Denies numbness, tingling or new weaknesses Behavioral/Psych: Mood is stable, no new changes  Breast: Denies any palpable lumps or discharge All other systems were reviewed with the patient and are negative.  PHYSICAL EXAMINATION: ECOG PERFORMANCE STATUS: 1 - Symptomatic but completely ambulatory  Vitals:   03/21/18 1259  BP: (!) 150/65  Pulse: 66  Resp: 17  Temp: 98 F (36.7 C)  SpO2: 99%   Filed Weights   03/21/18 1259  Weight: 143 lb 14.4 oz (65.3 kg)    GENERAL:alert, no distress and comfortable SKIN: skin color, texture, turgor are normal, no rashes or  significant lesions EYES: normal, conjunctiva are pink and non-injected, sclera clear OROPHARYNX:no exudate, no erythema and lips, buccal mucosa, and tongue normal  NECK: supple, thyroid normal size, non-tender, without nodularity LYMPH:  no palpable lymphadenopathy in the cervical, axillary or inguinal LUNGS: clear to auscultation and percussion with normal breathing effort HEART: regular rate & rhythm and no murmurs and no lower extremity edema ABDOMEN:abdomen soft, non-tender and normal bowel sounds Musculoskeletal:no cyanosis of digits and no clubbing  PSYCH: alert & oriented x 3 with fluent speech NEURO: no focal motor/sensory deficits  LABORATORY DATA:  I have reviewed the data as listed Lab Results  Component Value Date   WBC 7.3 02/23/2018   HGB 14.7 02/23/2018   HCT 45.5 02/23/2018   MCV 96.0 02/23/2018   PLT 248 02/23/2018   Lab Results  Component Value Date   NA 139 02/23/2018   K 3.9 02/23/2018   CL 102 02/23/2018   CO2 26 02/23/2018    RADIOGRAPHIC STUDIES: I have personally reviewed the radiological reports and agreed with the findings in the report.  ASSESSMENT AND PLAN:  Malignant neoplasm of central portion of right breast in female, estrogen receptor positive ([place]) 03/06/2018:Right lumpectomy: DCIS 0.3 cm cribriform type intermediate  grade, LCIS, fibrocystic change, sclerosing adenosis, no evidence of invasive ductal carcinoma, margins negative, ER 95%, PR 80%, HER-2 equivocal 2+ by IHC (these immunostains were done at [facility] on the biopsy and not repeated on the final), T1BN0 stage Ia (based on biopsy showing invasive cancer)  Pathology counseling: I discussed the final pathology report of the patient provided  a copy of this report. I discussed the margins as well as lymph node surgeries. We also discussed the final staging along with previously performed ER/PR and HER-2/neu testing.  Recommendation: 1.  +/- Adjuvant radiation therapy 2.  Followed by adjuvant antiestrogen therapy  Dr. Isidore Moos recommended that she needs either radiation or hormone therapy.  Patient is not able to make a decision.  We would like to give her a 30-day supply of anastrozole and see if she tolerates it.  She will then make a decision on radiation versus hormone therapy.  Did not recommend repeating immunostains.  Even if the small amount of invasive cancer was HER-2 positive it would not warrant systemic therapy and hence did not request FISH testing on the biopsy sample.  Further appointments based on her decision.  All questions were answered. The patient knows to call the clinic with any problems, questions or concerns.   Nicholas Lose, MD 03/21/2018   Julious Oka Dorshimer, am acting as scribe for Nicholas Lose, MD.  I have reviewed the above documentation for accuracy and completeness, and I agree with the above.

## 2018-03-20 ENCOUNTER — Ambulatory Visit: Payer: Self-pay | Admitting: Hematology and Oncology

## 2018-03-20 ENCOUNTER — Ambulatory Visit: Payer: Self-pay | Admitting: Radiation Oncology

## 2018-03-20 ENCOUNTER — Ambulatory Visit: Payer: Self-pay

## 2018-03-21 ENCOUNTER — Encounter: Payer: Self-pay | Admitting: *Deleted

## 2018-03-21 ENCOUNTER — Encounter: Payer: Self-pay | Admitting: Radiation Oncology

## 2018-03-21 ENCOUNTER — Other Ambulatory Visit: Payer: Self-pay

## 2018-03-21 ENCOUNTER — Ambulatory Visit
Admission: RE | Admit: 2018-03-21 | Discharge: 2018-03-21 | Disposition: A | Discharge status: Home / Self Care | Payer: Medicare Other | Source: Ambulatory Visit | Attending: Radiation Oncology | Admitting: Radiation Oncology

## 2018-03-21 ENCOUNTER — Inpatient Hospital Stay: Payer: Medicare Other | Attending: Hematology and Oncology | Admitting: Hematology and Oncology

## 2018-03-21 VITALS — BP 151/70 | HR 62 | Temp 98.1°F | Resp 18 | Ht 66.0 in | Wt 143.4 lb

## 2018-03-21 DIAGNOSIS — C50111 Malignant neoplasm of central portion of right female breast: Secondary | ICD-10-CM

## 2018-03-21 DIAGNOSIS — Z170 Estrogen receptor positive status [ER+]: Secondary | ICD-10-CM

## 2018-03-21 DIAGNOSIS — E039 Hypothyroidism, unspecified: Secondary | ICD-10-CM | POA: Diagnosis not present

## 2018-03-21 DIAGNOSIS — I10 Essential (primary) hypertension: Secondary | ICD-10-CM | POA: Diagnosis not present

## 2018-03-21 DIAGNOSIS — Z87891 Personal history of nicotine dependence: Secondary | ICD-10-CM | POA: Diagnosis not present

## 2018-03-21 DIAGNOSIS — Z79899 Other long term (current) drug therapy: Secondary | ICD-10-CM | POA: Diagnosis not present

## 2018-03-21 MED ORDER — ANASTROZOLE 1 MG PO TABS: 1.0000 mg | ORAL_TABLET | Freq: Every day | ORAL | 0 refills | Status: DC

## 2018-03-21 NOTE — Progress Notes (Signed)
Radiation Oncology         [phone] ________________________________  Initial outpatient Consultation  Name: Dana Levine MRN: [id number]  Date: 03/21/2018  DOB: 1929-12-05  [id number], Pcp Not In  Jovita Kussmaul, MD   REFERRING PHYSICIAN: Autumn Messing III, MD  DIAGNOSIS:    ICD-10-CM   1. Malignant neoplasm of central portion of right breast in female, estrogen receptor positive ([place]) C50.111    Z17.0    T52mc vs Tis N0 M0 Right breast cancer/DCIS ER+ / PR+, Grade 2  Cancer Staging Malignant neoplasm of central portion of right breast in female, estrogen receptor positive ([place] Staging form: Breast, AJCC 8th Edition - Pathologic: Stage 0 (pTis (DCIS), pN0, cM0) - Unsigned   CHIEF COMPLAINT: Here to discuss management of right breast DCIS and small focus of cancer on biopsy  HISTORY OF PRESENT ILLNESS::Dana LLadoris Lythgoeis a 85y.o. female who presented with breast abnormality on the following imaging: right breast mammogram with targeted ultrasound on the date of 12/28/2017.  Ultrasound revealed a 7 mm irregularly marginated solid nodule in the posterior central right breast.  Symptoms, if any, at that time, were: none.  [facility] showed invasive ductal carcinoma, 6 mm, with ductal carcinoma in situ, low to intermediate grade, solid and cribriform type with focal comedonecrosis.  ER status: 95%, strong; PR status: 80%, strong; Her2 status equivocal; Grade 2.  She proceeded to right breast lumpectomy on 03/06/2018 under Dr. TMarlou Starks Pathology from the procedure revealed: DCIS, intermediate nuclear grade, cribriform histology; lobular carcinoma in situ. Dr. TMarlou Starksrequested reevaluation of the patient's initial biopsy to confirm cancer on 03/17/2018. Results returned invasive ductal carcinoma, grade 1, and DCIS with calcifications (low to intermediate grade, solid and cribriform types).  She is scheduled to see Dr. GLindi Adiein consult later  today, 03/21/2018.  On review of systems, she denies lymphedema issues and any pain. She is accompanied by her husband and daughter. She is in excellent health. She plans to stay with her [place] if she receives [place]   PREVIOUS RADIATION THERAPY: No  PAST MEDICAL HISTORY:  has a past medical history of Anemia, Arthritis, Cancer ([place], Dyspnea, GERD (gastroesophageal reflux disease), Hypertension, and Hypothyroidism.    PAST SURGICAL HISTORY: Past Surgical History:  Procedure Laterality Date  . APPENDECTOMY    . BREAST LUMPECTOMY WITH RADIOACTIVE SEED LOCALIZATION Right 03/06/2018   Procedure: RIGHT BREAST LUMPECTOMY WITH RADIOACTIVE SEED LOCALIZATION;  Surgeon: TJovita Kussmaul MD;  [facility]  Service: General;  Laterality: Right;  . EYE SURGERY Bilateral    cataracts removed  . JOINT REPLACEMENT Right    hip   . THYROIDECTOMY, PARTIAL    . TONSILLECTOMY      FAMILY HISTORY: family history includes Colon cancer in her father; Pancreatic cancer in her daughter.  SOCIAL HISTORY:  reports that she quit smoking about 50 years ago. She has never used smokeless tobacco. She reports current alcohol use of about 1.0 standard drinks of alcohol per week. She reports that she does not use drugs.  ALLERGIES: Patient has no known allergies.  MEDICATIONS:  Current Outpatient Medications  Medication Sig Dispense Refill  . atenolol (TENORMIN) 50 MG tablet Take 50 mg by mouth daily.    . Coenzyme Q10 (CO Q10) 100 MG CAPS Take 100 mg by mouth daily.    . hydrochlorothiazide (HYDRODIURIL) 25 MG tablet Take 25 mg by mouth daily.    .Marland Kitchen  levothyroxine (SYNTHROID, LEVOTHROID) 75 MCG tablet Take 75 mcg by mouth daily before breakfast.    . losartan (COZAAR) 25 MG tablet Take 25 mg by mouth daily.    . Multiple Vitamin (MULTIVITAMIN WITH MINERALS) TABS tablet Take 1 tablet by mouth daily.    . Omega-3 Fatty Acids (FISH OIL) 1200 MG CAPS Take 1,200 mg by mouth daily.    . TURMERIC PO  Take 1 tablet by mouth daily.    . Vitamin D-Vitamin K (VITAMIN K2-VITAMIN D3 PO) Take 1 tablet by mouth daily.     No current facility-administered medications for this encounter.     REVIEW OF SYSTEMS: A 10+ POINT REVIEW OF SYSTEMS WAS OBTAINED including neurology, dermatology, psychiatry, cardiac, respiratory, lymph, extremities, GI, GU, Musculoskeletal, constitutional, breasts, reproductive, HEENT.  All pertinent positives are noted in the HPI.  All others are negative.    PHYSICAL EXAM:  height is 5' 6"  (1.676 m) and weight is 143 lb 6.4 oz (65 kg). Her oral temperature is 98.1 F (36.7 C). Her blood pressure is 151/70 (abnormal) and her pulse is 62. Her respiration is 18 and oxygen saturation is 98%.   General: Alert and oriented, in no acute distress HEENT: Head is normocephalic. Extraocular movements are intact. Oropharynx is clear. + hearing aid Neck: Neck is supple. She has some fullness in the thyroid region that is nonspecific. Heart: Regular in rate and rhythm with no murmurs, rubs, or gallops. Chest: Clear to auscultation bilaterally, with no rhonchi, wheezes, or rales. She has a scar in the upper chest from a partial thyroidectomy years ago.  Abdomen: Soft, nontender, nondistended, with no rigidity or guarding. Extremities: No cyanosis or edema. Lymphatics: see Neck Exam Skin: No concerning lesions. Musculoskeletal: symmetric strength and muscle tone throughout. Neurologic: Cranial nerves II through XII are grossly intact. No obvious focalities. Speech is fluent. Coordination is intact. Psychiatric: Judgment and insight are intact. Affect is appropriate. Breasts: She has a central lumpectomy scar on the right breast that is healing well. No other palpable masses appreciated in the breasts or axillae .   ECOG = 0  0 - Asymptomatic (Fully active, able to carry on all predisease activities without restriction)  1 - Symptomatic but completely ambulatory (Restricted in  physically strenuous activity but ambulatory and able to carry out work of a light or sedentary nature. For example, light housework, office work)  2 - Symptomatic, <50% in bed during the day (Ambulatory and capable of all self care but unable to carry out any work activities. Up and about more than 50% of waking hours)  3 - Symptomatic, >50% in bed, but not bedbound (Capable of only limited self-care, confined to bed or chair 50% or more of waking hours)  4 - Bedbound (Completely disabled. Cannot carry on any self-care. Totally confined to bed or chair)  5 - Death   Eustace Pen MM, Creech RH, Tormey DC, et al. [phone]). "Toxicity and response criteria of the [facility] Group". [place] Oncol. 5 (6): 649-55   LABORATORY DATA:  Lab Results  Component Value Date   WBC 7.3 02/23/2018   HGB 14.7 02/23/2018   HCT 45.5 02/23/2018   MCV 96.0 02/23/2018   PLT 248 02/23/2018   CMP     Component Value Date/Time   NA 139 02/23/2018 1357   K 3.9 02/23/2018 1357   CL 102 02/23/2018 1357   CO2 26 02/23/2018 1357   GLUCOSE 101 (H) 02/23/2018 1357   BUN 22  02/23/2018 1357   CREATININE 0.90 02/23/2018 1357   CALCIUM 9.9 02/23/2018 1357   GFRNONAA 58 (L) 02/23/2018 1357   GFRAA >60 02/23/2018 1357         RADIOGRAPHY: Mm Breast Surgical Specimen  Result Date: 03/06/2018 CLINICAL DATA:  Specimen radiograph status post right breast lumpectomy. EXAM: SPECIMEN RADIOGRAPH OF THE RIGHT BREAST COMPARISON:  Previous exam(s). FINDINGS: Status post excision of the right breast. The radioactive seed and biopsy marker clip are present, completely intact, and were marked for pathology. The seed is close to one margin of the specimen, and this was conveyed to the OR at 8:48 a.m. IMPRESSION: Specimen radiograph of the right breast. Electronically Signed   By: Ammie Ferrier M.D.   On: 03/06/2018 08:48   Mm Rt Radioactive Seed Loc Mammo Guide  Result Date: 03/03/2018 CLINICAL DATA:   85 year old female presenting for radioactive seed localization of the right breast prior to lumpectomy. EXAM: MAMMOGRAPHIC GUIDED RADIOACTIVE SEED LOCALIZATION OF THE RIGHT BREAST COMPARISON:  Previous exam(s). FINDINGS: Patient presents for radioactive seed localization prior to right breast lumpectomy. I met with the patient and we discussed the procedure of seed localization including benefits and alternatives. We discussed the high likelihood of a successful procedure. We discussed the risks of the procedure including infection, bleeding, tissue injury and further surgery. We discussed the low dose of radioactivity involved in the procedure. Informed, written consent was given. The usual time-out protocol was performed immediately prior to the procedure. Using mammographic guidance, sterile technique, 1% lidocaine and an I-125 radioactive seed, the cylinder shaped biopsy marking clip in the central right was localized using a superior approach. The follow-up mammogram images confirm the seed in the expected location and were marked for Dr. Marlou Starks. Follow-up survey of the patient confirms presence of the radioactive seed. Order number of I-125 seed:  [id number]. Total activity:  2.947 millicuries reference Date: 02/24/2018 The patient tolerated the procedure well and was released from the [facility]. She was given instructions regarding seed removal. IMPRESSION: Radioactive seed localization right breast. No apparent complications. Electronically Signed   By: Ammie Ferrier M.D.   On: 03/03/2018 13:56      IMPRESSION/PLAN: Right Breast DCIS   For the patient's early stage favorable risk breast cancer, we had a thorough discussion about her options for adjuvant therapy. One option would be antiestrogen therapy as discussed with medical oncology. She would take a pill for approximately 5 years. The alternative option would be radiotherapy to the breast. The most aggressive option would be to pursue both  modalities (but I told her I thought this would be more therapy than necessary).   Of note, I discussed the data from the [employer] al trial in the [employer]. She understands that tamoxifen compared to radiation plus tamoxifen demonstrated no survival benefit among the women in this study. The women were 21 years or older with stage I estrogen receptor positive breast cancer. Extrapolating from this study, I told Ms. Finnigan that her overall life expectancy should not be affected by adding radiotherapy to antiestrogen medication. She understands that the main benefit of radiotherapy would be a very small but measurable local control benefit (risk of local recurrence to be lowered from ~9% --> ~2%). With radiation alone, I would except the local recurrence risk to be between 2-9%.  We discussed the risks benefits and side effects of radiotherapy. She understands that the side effects would likely include some skin irritation and fatigue during  the weeks of radiation. There is a risk of late effects which include but are not necessarily limited to cosmetic changes and rare lung toxicity. I would anticipate delivering approximately 3 weeks of radiotherapy: I believe her anatomy is conducive to hypofractionation, she would probably receive 40.05 Gray in 15 fractions.  If she opts to proceed with radiation, she would prefer to be simmed in the second week or so of March and to begin treatments after 04/18/2018 due to prior plans.  She has my contact information and will call when she decides.    __________________________________________   Eppie Gibson, MD   This document serves as a record of services personally performed by Eppie Gibson, MD. It was created on her behalf by Wilburn Mylar, a trained medical scribe. The creation of this record is based on the scribe's personal observations and the provider's statements to them. This document has been checked and approved by the  attending provider.

## 2018-03-21 NOTE — Assessment & Plan Note (Signed)
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 I discussed the margins as well as lymph node surgeries. We also discussed the final staging along with previously performed ER/PR and HER-2/neu testing.  Recommendation: 1.  Adjuvant radiation therapy 2.  Followed by adjuvant antiestrogen therapy  Did not recommend repeating immunostains.  Even if the small amount of invasive cancer was HER-2 positive it would not warrant systemic therapy and hence did not request FISH testing on the biopsy sample.  Return to clinic at the end of radiation to discuss antiestrogen therapy. 

## 2018-03-22 ENCOUNTER — Telehealth: Payer: Self-pay | Admitting: Hematology and Oncology

## 2018-03-22 NOTE — Telephone Encounter (Signed)
No los °

## 2018-04-13 ENCOUNTER — Other Ambulatory Visit: Payer: Self-pay | Admitting: *Deleted

## 2018-04-13 MED ORDER — ANASTROZOLE 1 MG PO TABS: 1.0000 mg | ORAL_TABLET | Freq: Every day | ORAL | 3 refills | Status: DC

## 2018-04-13 NOTE — Telephone Encounter (Signed)
Received call from patient that she has decided to do anti-estrogen only and no radiation.  Called refill into her pharmacy and told her we would get her in for survivorship appointment in a few months.

## 2018-04-19 ENCOUNTER — Telehealth: Payer: Self-pay | Admitting: *Deleted

## 2018-04-19 NOTE — Telephone Encounter (Signed)
Late entry- Received call from patient last week 3/19 stating she has decided against radiation therapy and will take an ai.  Anastrozole called into her pharmacy and informed her she would be seen in our survivorship clinic when appropriate.  Encouraged her to call with any concerns or if she has any problems with the medication. Patient verbalized understanding.

## 2018-04-27 ENCOUNTER — Telehealth: Payer: Self-pay | Admitting: Adult Health

## 2018-04-27 NOTE — Telephone Encounter (Signed)
Scheduled per sch msg. Called and spoke with patient. Confirmed date and time ° °

## 2018-07-27 ENCOUNTER — Telehealth: Payer: Self-pay | Admitting: Adult Health

## 2018-07-27 NOTE — Telephone Encounter (Signed)
I left a message regarding video visit  °

## 2018-07-31 ENCOUNTER — Ambulatory Visit: Payer: Medicare Other | Admitting: Adult Health

## 2018-08-18 ENCOUNTER — Telehealth: Payer: Self-pay | Admitting: Adult Health

## 2018-08-18 NOTE — Telephone Encounter (Signed)
Confirmed appt and verified info. °

## 2018-08-21 ENCOUNTER — Other Ambulatory Visit: Payer: Self-pay | Admitting: Hematology and Oncology

## 2018-08-21 ENCOUNTER — Inpatient Hospital Stay: Payer: Medicare Other | Attending: Adult Health | Admitting: Adult Health

## 2018-08-21 ENCOUNTER — Encounter: Payer: Self-pay | Admitting: Adult Health

## 2018-08-21 DIAGNOSIS — Z87891 Personal history of nicotine dependence: Secondary | ICD-10-CM | POA: Diagnosis not present

## 2018-08-21 DIAGNOSIS — Z170 Estrogen receptor positive status [ER+]: Secondary | ICD-10-CM

## 2018-08-21 DIAGNOSIS — Z79811 Long term (current) use of aromatase inhibitors: Secondary | ICD-10-CM | POA: Diagnosis not present

## 2018-08-21 DIAGNOSIS — C50111 Malignant neoplasm of central portion of right female breast: Secondary | ICD-10-CM

## 2018-08-21 DIAGNOSIS — M810 Age-related osteoporosis without current pathological fracture: Secondary | ICD-10-CM

## 2018-08-21 NOTE — Progress Notes (Signed)
SURVIVORSHIP VIRTUAL VISIT:  I connected with Dana Levine on 08/21/18 at  2:00 PM EDT by my chart video and verified that I am speaking with the correct person using two identifiers.   I discussed the limitations, risks, security and privacy concerns of performing an evaluation and management service virtually and the availability of in person appointments. I also discussed with the patient that there may be a patient responsible charge related to this service. The patient expressed understanding and agreed to proceed.   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, LCIS, fibrocystic change, sclerosing adenosis, no evidence of invasive ductal carcinoma, margins negative, ER 95%, PR 80%, HER-2 equivocal 2+ by IHC (these immunostains were done at [facility] on the biopsy and not repeated on the final), T1BN0 stage Ia (based on biopsy showing invasive cancer)   03/15/2018 Initial Diagnosis   Screening mammogram detected 9 mm right breast cancer central portion of the right breast, biopsy revealed grade 1 IDC WITH DCIS ER PR positive HER-2 negative ([facility] biopsy), T1BN0 stage Ia clinical stage   03/2018 -  Anti-estrogen oral therapy   Anastrozole daily     INTERVAL HISTORY:  Dana Levine to review her survivorship care plan detailing her treatment course for breast cancer, as well as monitoring long-term side effects of that treatment, education regarding health maintenance, screening, and overall wellness and health promotion.     Overall, Dana Levine reports feeling quite well.  She is taking Anastrozole daily.  She denies any hot flashes, arthritis, or vaginal dryness.  She did have some stool changes that she saw GI for before the pandemic and wonders if she should follow up on that.  She also  notes mild fatigue.    REVIEW OF SYSTEMS:  Review of Systems  Constitutional: Negative for appetite change, chills, fatigue, fever and unexpected weight change.  HENT:   Negative for hearing loss, lump/mass, sore throat and trouble swallowing.   Eyes: Negative for eye problems and icterus.  Respiratory: Negative for chest tightness, cough and shortness of breath.   Cardiovascular: Negative for chest pain, leg swelling and palpitations.  Gastrointestinal: Negative for abdominal distention, abdominal pain, constipation, diarrhea, nausea and vomiting.  Endocrine: Negative for hot flashes.  Musculoskeletal: Negative for arthralgias.  Skin: Negative for itching and rash.  Neurological: Negative for dizziness, extremity weakness, headaches and numbness.  Hematological: Negative for adenopathy. Does not bruise/bleed easily.  Psychiatric/Behavioral: Negative for depression. The patient is not nervous/anxious.   Breast: Denies any new nodularity, masses, tenderness, nipple changes, or nipple discharge.      ONCOLOGY TREATMENT TEAM:  1. Surgeon:  Dr. Marlou Starks at [facility] 2. Medical Oncologist: Dr. Lindi Adie  3. Radiation Oncologist: Dr. Isidore Moos    PAST MEDICAL/SURGICAL HISTORY:  Past Medical History:  Diagnosis Date  . Anemia   . Arthritis   . Cancer [facility])    Right Breast Cancer  . Dyspnea    with exertion  . GERD (gastroesophageal reflux disease)   . Hypertension   . Hypothyroidism    Past Surgical History:  Procedure Laterality Date  . APPENDECTOMY    . BREAST LUMPECTOMY WITH RADIOACTIVE SEED LOCALIZATION Right 03/06/2018   Procedure: RIGHT BREAST LUMPECTOMY WITH RADIOACTIVE SEED LOCALIZATION;  Surgeon: Jovita Kussmaul, MD;  Location: [place];  Service: General;  Laterality: Right;  .  EYE SURGERY Bilateral    cataracts removed  . JOINT REPLACEMENT Right    hip   . THYROIDECTOMY, PARTIAL    . TONSILLECTOMY       ALLERGIES:  No Known Allergies   CURRENT  MEDICATIONS:  Outpatient Encounter Medications as of 08/21/2018  Medication Sig  . anastrozole (ARIMIDEX) 1 MG tablet Take 1 tablet (1 mg total) by mouth daily.  Marland Kitchen atenolol (TENORMIN) 50 MG tablet Take 50 mg by mouth daily.  . Coenzyme Q10 (CO Q10) 100 MG CAPS Take 100 mg by mouth daily.  . hydrochlorothiazide (HYDRODIURIL) 25 MG tablet Take 25 mg by mouth daily.  Marland Kitchen levothyroxine (SYNTHROID, LEVOTHROID) 75 MCG tablet Take 75 mcg by mouth daily before breakfast.  . losartan (COZAAR) 25 MG tablet Take 25 mg by mouth daily.  . Multiple Vitamin (MULTIVITAMIN WITH MINERALS) TABS tablet Take 1 tablet by mouth daily.  . Omega-3 Fatty Acids (FISH OIL) 1200 MG CAPS Take 1,200 mg by mouth daily.  . TURMERIC PO Take 1 tablet by mouth daily.  . Vitamin D-Vitamin K (VITAMIN K2-VITAMIN D3 PO) Take 1 tablet by mouth daily.   No facility-administered encounter medications on file as of 08/21/2018.      ONCOLOGIC FAMILY HISTORY:  Family History  Problem Relation Age of Onset  . Colon cancer Father   . Pancreatic cancer Daughter      GENETIC COUNSELING/TESTING: Not at this time  SOCIAL HISTORY:  Social History   Socioeconomic History  . Marital status: Married    Spouse name: Not on file  . Number of children: Not on file  . Years of education: Not on file  . Highest education level: Not on file  Occupational History  . Not on file  Social Needs  . Financial resource strain: Not on file  . Food insecurity    Worry: Not on file    Inability: Not on file  . Transportation needs    Medical: No    Non-medical: No  Tobacco Use  . Smoking status: Former Smoker    Quit date: 1970    Years since quitting: 50.6  . Smokeless tobacco: Never Used  Substance and Sexual Activity  . Alcohol use: Yes    Alcohol/week: 1.0 standard drinks    Types: 1 Glasses of wine per week  . Drug use: Never  . Sexual activity: Not on file  Lifestyle  . Physical activity    Days per week: Not on file     Minutes per session: Not on file  . Stress: Not on file  Relationships  . Social Herbalist on phone: Not on file    Gets together: Not on file    Attends religious service: Not on file    Active member of club or organization: Not on file    Attends meetings of clubs or organizations: Not on file    Relationship status: Not on file  . Intimate partner violence    Fear of current or ex partner: No    Emotionally abused: No    Physically abused: No    Forced sexual activity: No  Other Topics Concern  . Not on file  Social History Narrative  . Not on file     OBSERVATIONS/OBJECTIVE:  Patient appears well.  In no apparent distress.  Skin visualized without rash or lesion.  Breathing is non labored.  Mood and behavior are normal.    LABORATORY DATA:  None for this visit.  DIAGNOSTIC IMAGING:  None for this visit.      ASSESSMENT AND PLAN:  Ms.. Whitacre is a pleasant 85 y.o. female with Stage IA right breast invasive ductal carcinoma, ER+/PR+/HER2-, diagnosed in 02/2018, treated with lumpectomy, and anti-estrogen therapy with Anastrozole beginning in 03/2018.  She presents to the Survivorship Clinic for our initial meeting and routine follow-up post-completion of treatment for breast cancer.    1. Stage IA right breast cancer:  Ms. Colclasure is continuing to recover from definitive treatment for breast cancer. She will follow-up with her medical oncologist, Dr. Lindi Adie in 02/2019 with history and physical exam per surveillance protocol.  She will continue her anti-estrogen therapy with Anastrozole. Thus far, she is tolerating the Anastrozole well, with minimal side effects. She was instructed to make Dr. Lindi Adie or myself aware if she begins to experience any worsening side effects of the medication and I could see her back in clinic to help manage those side effects, as needed. Her mammogram is due 01/2019; orders placed today. Today, a comprehensive survivorship care plan and  treatment summary was reviewed with the patient today detailing her breast cancer diagnosis, treatment course, potential late/long-term effects of treatment, appropriate follow-up care with recommendations for the future, and patient education resources.  A copy of this summary, along with a letter will be sent to the patient's primary care provider via mail/fax/In Basket message after today's visit.    2. Stool changes: I recommended she f/u with her GI and undergo repeat studies under their guidance for her stool issues.    2. Bone health:  Given Ms. Ihrig age/history of breast cancer and her current treatment regimen including anti-estrogen therapy with Anastrozole, she is at risk for bone demineralization.  She cannot recall when her last bone density test was, but says it was a long time ago, and she thinks she needs another one.  She cannot recall the results.  She would like this done in [place] at [facility].  In the meantime, she was encouraged to increase her consumption of foods rich in calcium, as well as increase her weight-bearing activities.  She was given education on specific activities to promote bone health.  3. Cancer screening:  Due to Ms. Boman history and her age, she should receive screening for skin cancers, colon cancer, and gynecologic cancers.  The information and recommendations are listed on the patient's comprehensive care plan/treatment summary and were reviewed in detail with the patient.    4. Health maintenance and wellness promotion: Ms. Degenhart was encouraged to consume 5-7 servings of fruits and vegetables per day. We reviewed the "Nutrition Rainbow" handout, as well as the handout "Take Control of Your Health and Reduce Your Cancer Risk" from the [place].  She was also encouraged to engage in moderate to vigorous exercise for 30 minutes per day most days of the week. We discussed the [employer] fitness program, which is designed  for cancer survivors to help them become more physically fit after cancer treatments.  She was instructed to limit her alcohol consumption and continue to abstain from tobacco use.     5. Support services/counseling: It is not uncommon for this period of the patient's cancer care trajectory to be one of many emotions and stressors.  We discussed how this can be increasingly difficult during the times of quarantine and social distancing due to the COVID-19 pandemic.   She was given information regarding our available services and encouraged to contact me with  any questions or for help enrolling in any of our support group/programs.    Follow up instructions:    -Return to cancer center in 02/2019 for f/u with Dr. Lindi Adie  -Mammogram due in 01/2019 -Follow up with surgery per Dr. Marlou Starks -She is welcome to return back to the Survivorship Clinic at any time; no additional follow-up needed at this time.  -Consider referral back to survivorship as a long-term survivor for continued surveillance  The patient was provided an opportunity to ask questions and all were answered. The patient agreed with the plan and demonstrated an understanding of the instructions.   The patient was advised to call back or seek an in-person evaluation if the symptoms worsen or if the condition fails to improve as anticipated.   I provided 20 minutes of face-to-face video visit time during this encounter, and > 50% was spent counseling as documented under my assessment & plan.  Scot Dock, NP

## 2018-08-22 ENCOUNTER — Telehealth: Payer: Self-pay | Admitting: Hematology and Oncology

## 2018-08-22 NOTE — Telephone Encounter (Signed)
I talk with patient regarding schedule  

## 2019-01-02 ENCOUNTER — Encounter: Payer: Self-pay | Admitting: *Deleted

## 2019-03-07 ENCOUNTER — Telehealth: Payer: Self-pay | Admitting: Hematology and Oncology

## 2019-03-07 NOTE — Telephone Encounter (Signed)
Returned patient's phone call regarding cancelling 02/25 appointment, per patient's request appointment has been cancelled. Patient will call when ready to reschedule.

## 2019-03-22 ENCOUNTER — Ambulatory Visit: Payer: Medicare Other | Admitting: Hematology and Oncology

## 2019-04-13 ENCOUNTER — Other Ambulatory Visit: Payer: Self-pay | Admitting: Hematology and Oncology

## 2019-07-24 ENCOUNTER — Other Ambulatory Visit: Payer: Self-pay | Admitting: Hematology and Oncology

## 2019-07-24 ENCOUNTER — Telehealth: Payer: Self-pay | Admitting: Hematology and Oncology

## 2019-07-24 NOTE — Telephone Encounter (Signed)
Scheduled appt per 6/29 sch message - no answer. Left message and mailed reminder letter with appt .

## 2019-09-24 NOTE — Progress Notes (Signed)
HEMATOLOGY-ONCOLOGY MYCHART VIDEO VISIT PROGRESS NOTE  I connected with Dana Levine on 09/25/2019 at  3:00 PM EDT by MyChart video conference and verified that I am speaking with the correct person using two identifiers.  I discussed the limitations, risks, security and privacy concerns of performing an evaluation and management service by MyChart and the availability of in person appointments.  I also discussed with the patient that there may be a patient responsible charge related to this service. The patient expressed understanding and agreed to proceed.  Patient's Location: Home Physician Location: Clinic  CHIEF COMPLIANT: Follow-up of right breast DCIS on anastrozole   INTERVAL HISTORY: Dana Levine is a 86 y.o. female with above-mentioned history of right breast DCIS who underwent a lumpectomy, declined radiation, and is currently on antiestrogen therapy with anastrozole. She presents over MyChart today for follow-up.  She is tolerating anastrozole extremely well without any problems or concerns.  Denies any hot flashes.  She is always had chronic stiffness of her extremities.  Oncology History  Malignant neoplasm of central portion of right breast in female, estrogen receptor positive ([place])  03/06/2018 Surgery   Right lumpectomy: DCIS 0.3 cm cribriform type intermediate grade, LCIS, fibrocystic change, sclerosing adenosis, no evidence of invasive ductal carcinoma, margins negative, ER 95%, PR 80%, HER-2 equivocal 2+ by IHC (these immunostains were done at [facility] on the biopsy and not repeated on the final), T1BN0 stage Ia (based on biopsy showing invasive cancer)   03/15/2018 Initial Diagnosis   Screening mammogram detected 9 mm right breast cancer central portion of the right breast, biopsy revealed grade 1 IDC WITH DCIS ER PR positive HER-2 negative ([facility] biopsy), T1BN0 stage Ia clinical stage   03/2018 -  Anti-estrogen oral therapy   Anastrozole  daily     Observations/Objective:  There were no vitals filed for this visit. There is no height or weight on file to calculate BMI.  I have reviewed the data as listed CMP Latest Ref Rng & Units 02/23/2018  Glucose 70 - 99 mg/dL 101(H)  BUN 8 - 23 mg/dL 22  Creatinine 0.44 - 1.00 mg/dL 0.90  Sodium 135 - 145 mmol/L 139  Potassium 3.5 - 5.1 mmol/L 3.9  Chloride 98 - 111 mmol/L 102  CO2 22 - 32 mmol/L 26  Calcium 8.9 - 10.3 mg/dL 9.9    Lab Results  Component Value Date   WBC 7.3 02/23/2018   HGB 14.7 02/23/2018   HCT 45.5 02/23/2018   MCV 96.0 02/23/2018   PLT 248 02/23/2018      Assessment Plan:  Malignant neoplasm of central portion of right breast in female, estrogen receptor positive ([place]) 03/06/2018:Right lumpectomy: DCIS 0.3 cm cribriform type intermediate grade, LCIS, fibrocystic change, sclerosing adenosis, no evidence of invasive ductal carcinoma, margins negative, ER 95%, PR 80%, HER-2 equivocal 2+ by IHC (these immunostains were done at [facility] on the biopsy and not repeated on the final), T1BN0 stage Ia (based on biopsy showing invasive cancer)  Current treatment: Adjuvant antiestrogen therapy started 03/21/2018  Anastrozole toxicities: Tolerating it very well without any hot flashes or myalgias. Breast cancer surveillance: 1.  Breast exam 09/25/2019: Benign 2. recommended that she get annual mammograms in February. She will contact with the primary care physician and get it done at [place] closer to her home.  Return to clinic in 1 year for follow-up through a MyChart virtual visit.    I discussed the assessment and treatment plan with the  patient. The patient was provided an opportunity to ask questions and all were answered. The patient agreed with the plan and demonstrated an understanding of the instructions. The patient was advised to call back or seek an in-person evaluation if the symptoms worsen or if the condition fails to improve as anticipated.     I provided 20 minutes of face-to-face MyChart video visit time during this encounter.    Rulon Eisenmenger, MD 09/25/2019   I, Molly Dorshimer, am acting as scribe for Nicholas Lose, MD.  I have reviewed the above documentation for accuracy and completeness, and I agree with the above.

## 2019-09-25 ENCOUNTER — Inpatient Hospital Stay: Payer: Medicare Other | Attending: Hematology and Oncology | Admitting: Hematology and Oncology

## 2019-09-25 DIAGNOSIS — C50111 Malignant neoplasm of central portion of right female breast: Secondary | ICD-10-CM | POA: Diagnosis not present

## 2019-09-25 DIAGNOSIS — Z170 Estrogen receptor positive status [ER+]: Secondary | ICD-10-CM

## 2019-09-25 NOTE — Assessment & Plan Note (Signed)
03/06/2018:Right lumpectomy: DCIS 0.3 cm cribriform type intermediate grade, LCIS, fibrocystic change, sclerosing adenosis, no evidence of invasive ductal carcinoma, margins negative, ER 95%, PR 80%, HER-2 equivocal 2+ by IHC (these immunostains were done at [facility] on the biopsy and not repeated on the final), T1BN0 stage Ia (based on biopsy showing invasive cancer)  Current treatment: Adjuvant antiestrogen therapy started 03/21/2018  Anastrozole toxicities: Tolerating it very well without any hot flashes or myalgias. Breast cancer surveillance: 1.  Breast exam 09/25/2019: Benign 2. recommended that she get annual mammograms.  Return to clinic in 1 year for follow-up 

## 2019-09-27 ENCOUNTER — Telehealth: Payer: Self-pay | Admitting: Hematology and Oncology

## 2019-09-27 NOTE — Telephone Encounter (Signed)
Scheduled per 8/31 los. Called and spoke with pt, confirmed 8/30 appt

## 2019-09-29 IMAGING — MG BREAST SURGICAL SPECIMEN
1 series · 1 of 1 positions shown · non-contrast
Comparison: Previous exam(s).

CLINICAL DATA: Specimen radiograph status post right breast
lumpectomy.

EXAM:
SPECIMEN RADIOGRAPH OF THE RIGHT BREAST

[R]
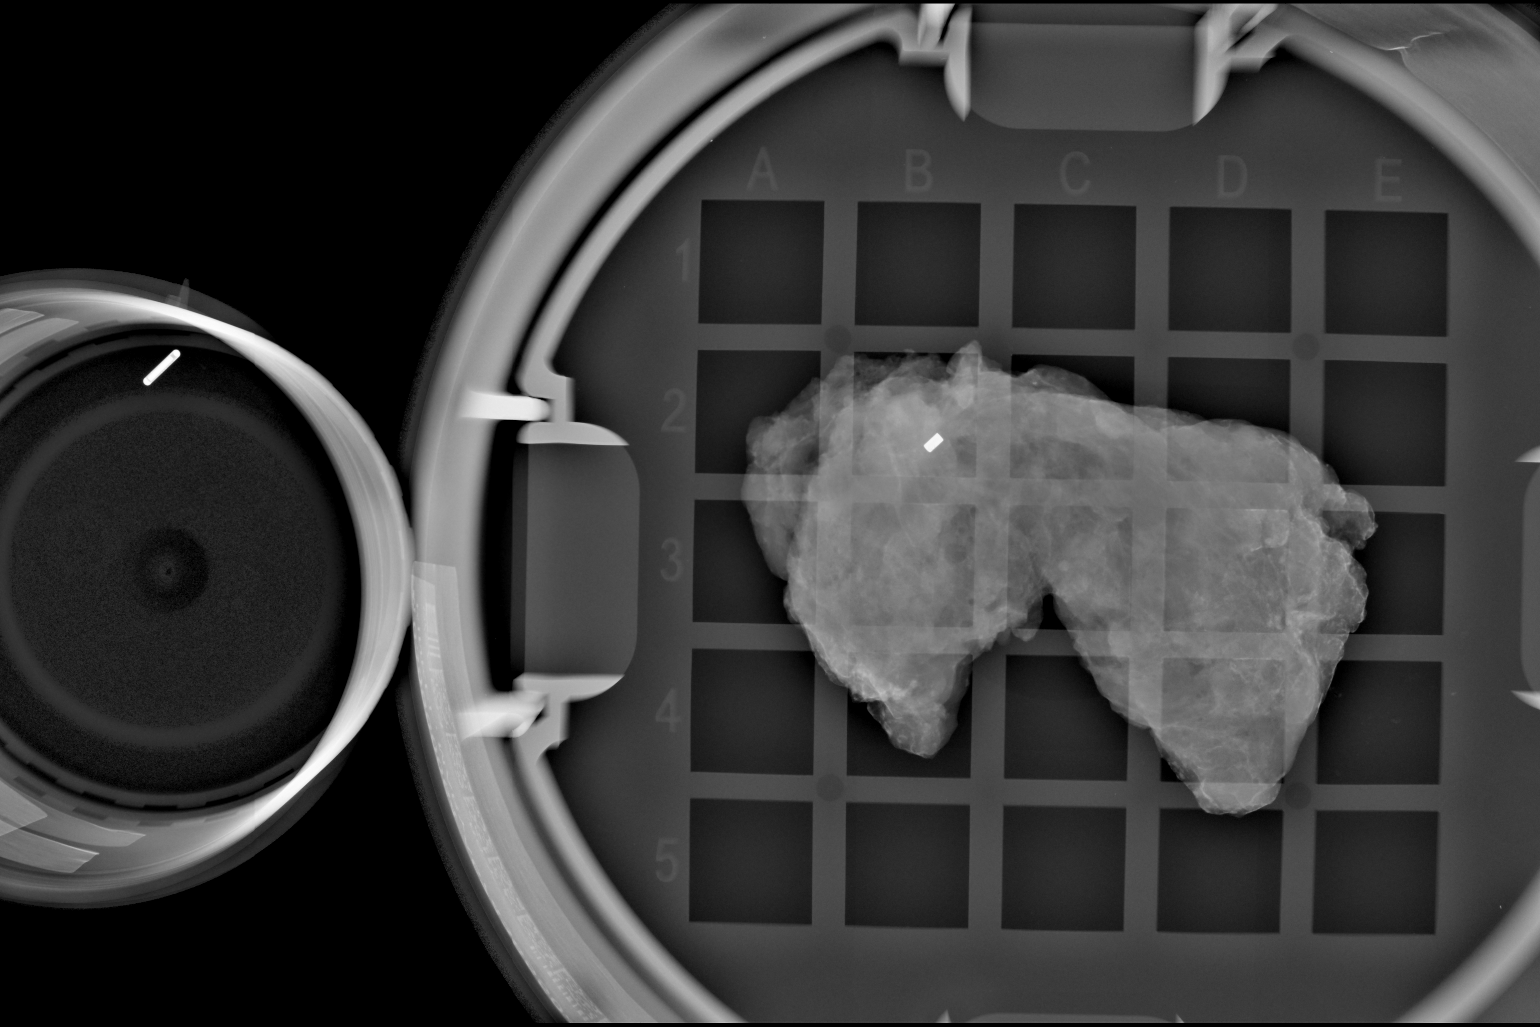

[1 of 1 positions shown; findings below may reference images not displayed]

FINDINGS: Status post excision of the right breast. The radioactive seed and
biopsy marker clip are present, completely intact, and were marked
for pathology. The seed is close to one margin of the specimen, and
this was conveyed to the OR at [DATE] a.m..
IMPRESSION: Specimen radiograph of the right breast.

## 2019-10-29 ENCOUNTER — Other Ambulatory Visit: Payer: Self-pay | Admitting: Hematology and Oncology

## 2020-01-26 ENCOUNTER — Other Ambulatory Visit: Payer: Self-pay | Admitting: Hematology and Oncology

## 2020-02-15 ENCOUNTER — Telehealth: Payer: Self-pay | Admitting: *Deleted

## 2020-02-15 MED ORDER — ANASTROZOLE 1 MG PO TABS: ORAL_TABLET | ORAL | 2 refills | Status: DC

## 2020-02-15 NOTE — Telephone Encounter (Signed)
Pt called requesting refill for Anastrozole 1 mg tablet.  Per MD okay to refill prescription to pharmacy on file.

## 2020-09-22 NOTE — Assessment & Plan Note (Signed)
/  10/2018:Right lumpectomy: DCIS 0.3 cm cribriform type intermediate grade, LCIS, fibrocystic change, sclerosing adenosis, no evidence of invasive ductal carcinoma, margins negative, ER 95%, PR 80%, HER-2 equivocal 2+ by IHC (these immunostains were done at [facility] on the biopsy and not repeated on the final), T1BN0 stage Ia (based on biopsy showing invasive cancer)  Current treatment: Adjuvant antiestrogen therapy started 03/21/2018  Anastrozole toxicities: Tolerating it very well without any hot flashes or myalgias. Breast cancer surveillance: 1.  Breast exam 09/25/2019: Benign 2. recommended that Dana Levine get annual mammograms in February. Dana Levine will contact with the primary care physician and get it done at [place] closer to her home.  Return to clinic in 1 year for follow-up through a MyChart virtual visit.

## 2020-09-22 NOTE — Progress Notes (Signed)
HEMATOLOGY-ONCOLOGY TELEPHONE VISIT PROGRESS NOTE  I connected with Dana Levine on 09/23/2020 at  3:15 PM EDT by MyChart video conference and verified that I am speaking with the correct person using two identifiers.  I discussed the limitations, risks, security and privacy concerns of performing an evaluation and management service by MyChart and the availability of in person appointments.  I also discussed with the patient that there may be a patient responsible charge related to this service. The patient expressed understanding and agreed to proceed.  Patient's Location: Home Physician Location: Clinic  CHIEF COMPLIANT: Follow-up of right breast DCIS on anastrozole   INTERVAL HISTORY: Dana Levine is a 87 y.o. female with above-mentioned history of right breast DCIS who underwent a lumpectomy, declined radiation, and is currently on antiestrogen therapy with anastrozole. She presents over MyChart today for follow-up.  She reports no major problems tolerating anastrozole therapy.  She denies any lumps or nodules in the breast.  In February she had a mammograms which were apparently normal with her primary care physician.  Oncology History  Malignant neoplasm of central portion of right breast in female, estrogen receptor positive ([place])  03/06/2018 Surgery   Right lumpectomy: DCIS 0.3 cm cribriform type intermediate grade, LCIS, fibrocystic change, sclerosing adenosis, no evidence of invasive ductal carcinoma, margins negative, ER 95%, PR 80%, HER-2 equivocal 2+ by IHC (these immunostains were done at [facility] on the biopsy and not repeated on the final), T1BN0 stage Ia (based on biopsy showing invasive cancer)   03/15/2018 Initial Diagnosis   Screening mammogram detected 9 mm right breast cancer central portion of the right breast, biopsy revealed grade 1 IDC WITH DCIS ER PR positive HER-2 negative ([facility] biopsy), T1BN0 stage Ia clinical stage   03/2018 -   Anti-estrogen oral therapy   Anastrozole daily     Observations/Objective:  There were no vitals filed for this visit. There is no height or weight on file to calculate BMI.  I have reviewed the data as listed CMP Latest Ref Rng & Units 02/23/2018  Glucose 70 - 99 mg/dL 101(H)  BUN 8 - 23 mg/dL 22  Creatinine 0.44 - 1.00 mg/dL 0.90  Sodium 135 - 145 mmol/L 139  Potassium 3.5 - 5.1 mmol/L 3.9  Chloride 98 - 111 mmol/L 102  CO2 22 - 32 mmol/L 26  Calcium 8.9 - 10.3 mg/dL 9.9    Lab Results  Component Value Date   WBC 7.3 02/23/2018   HGB 14.7 02/23/2018   HCT 45.5 02/23/2018   MCV 96.0 02/23/2018   PLT 248 02/23/2018      Assessment Plan:  Malignant neoplasm of central portion of right breast in female, estrogen receptor positive ([place]) 03/06/2018:Right lumpectomy: DCIS 0.3 cm cribriform type intermediate grade, LCIS, fibrocystic change, sclerosing adenosis, no evidence of invasive ductal carcinoma, margins negative, ER 95%, PR 80%, HER-2 equivocal 2+ by IHC (these immunostains were done at [facility] on the biopsy and not repeated on the final), T1BN0 stage Ia (based on biopsy showing invasive cancer)   Current treatment: Adjuvant antiestrogen therapy started 03/21/2018   Anastrozole toxicities: Tolerating it very well without any hot flashes or myalgias. Breast cancer surveillance: 1.  Breast exam 09/25/2019: Benign 2. recommended that she get annual mammograms in February.  Her primary care physician is Dr. Lind Covert phone number [phone]   Return to clinic in 1 year for follow-up through a telephone visit.    I discussed the assessment and treatment  plan with the patient. The patient was provided an opportunity to ask questions and all were answered. The patient agreed with the plan and demonstrated an understanding of the instructions. The patient was advised to call back or seek an in-person evaluation if the symptoms worsen or if the condition fails to improve as  anticipated.   Total time spent: 20 minutes including face-to-face MyChart video visit time and time spent for planning, charting and coordination of care  Rulon Eisenmenger, MD 09/23/2020  I, Thana Ates am acting as scribe for Nicholas Lose, MD.  I have reviewed the above documentation for accuracy and completeness, and I agree with the above.

## 2020-09-23 ENCOUNTER — Inpatient Hospital Stay: Payer: Medicare Other | Attending: Hematology and Oncology | Admitting: Hematology and Oncology

## 2020-09-23 DIAGNOSIS — C50111 Malignant neoplasm of central portion of right female breast: Secondary | ICD-10-CM

## 2020-09-23 DIAGNOSIS — Z170 Estrogen receptor positive status [ER+]: Secondary | ICD-10-CM

## 2020-09-23 MED ORDER — ANASTROZOLE 1 MG PO TABS: ORAL_TABLET | ORAL | 3 refills | Status: DC

## 2020-09-24 ENCOUNTER — Telehealth: Payer: Self-pay | Admitting: Hematology and Oncology

## 2020-09-24 NOTE — Telephone Encounter (Signed)
Scheduled appt called and spoke with pt confirmed appt

## 2021-08-19 ENCOUNTER — Other Ambulatory Visit: Payer: Self-pay | Admitting: Hematology and Oncology

## 2021-09-23 ENCOUNTER — Inpatient Hospital Stay: Payer: Medicare Other | Attending: Hematology and Oncology | Admitting: Hematology and Oncology

## 2021-09-23 DIAGNOSIS — C50111 Malignant neoplasm of central portion of right female breast: Secondary | ICD-10-CM

## 2021-09-23 DIAGNOSIS — Z170 Estrogen receptor positive status [ER+]: Secondary | ICD-10-CM

## 2021-09-23 NOTE — Progress Notes (Signed)
HEMATOLOGY-ONCOLOGY TELEPHONE VISIT PROGRESS NOTE  I connected with our patient on 09/23/21 at 11:45 AM EDT by telephone and verified that I am speaking with the correct person using two identifiers.  I discussed the limitations, risks, security and privacy concerns of performing an evaluation and management service by telephone and the availability of in person appointments.  I also discussed with the patient that there may be a patient responsible charge related to this service. The patient expressed understanding and agreed to proceed.   History of Present Illness:  Dana Levine is a 88 y.o. female with above-mentioned history of right breast DCIS who underwent a lumpectomy, declined radiation, and is currently on antiestrogen therapy with anastrozole. She presents to clinic via telephone visit.  Oncology History  Malignant neoplasm of central portion of right breast in female, estrogen receptor positive ([place])  03/06/2018 Surgery   Right lumpectomy: DCIS 0.3 cm cribriform type intermediate grade, LCIS, fibrocystic change, sclerosing adenosis, no evidence of invasive ductal carcinoma, margins negative, ER 95%, PR 80%, HER-2 equivocal 2+ by IHC (these immunostains were done at [facility] on the biopsy and not repeated on the final), T1BN0 stage Ia (based on biopsy showing invasive cancer)   03/15/2018 Initial Diagnosis   Screening mammogram detected 9 mm right breast cancer central portion of the right breast, biopsy revealed grade 1 IDC WITH DCIS ER PR positive HER-2 negative ([facility] biopsy), T1BN0 stage Ia clinical stage   03/2018 -  Anti-estrogen oral therapy   Anastrozole daily     REVIEW OF SYSTEMS:   Constitutional: Denies fevers, chills or abnormal weight loss All other systems were reviewed with the patient and are negative.  Observations/Objective:     Assessment Plan:  Malignant neoplasm of central portion of right breast in female, estrogen receptor  positive ([place]) 03/06/2018:Right lumpectomy: DCIS 0.3 cm cribriform type intermediate grade, LCIS, fibrocystic change, sclerosing adenosis, no evidence of invasive ductal carcinoma, margins negative, ER 95%, PR 80%, HER-2 equivocal 2+ by IHC (these immunostains were done at [facility] on the biopsy and not repeated on the final), T1BN0 stage Ia (based on biopsy showing invasive cancer)   Current treatment: Adjuvant antiestrogen therapy started 03/21/2018   Anastrozole toxicities: Tolerating it very well without any hot flashes or myalgias.  Breast cancer surveillance: 1.  Breast exam 09/25/2019: Benign 2. recommended that she get annual mammograms in February.   Her primary care physician is Dr. Lind Covert phone number [phone] Her husband and daughter had passed away 2 years ago.  Return to clinic in 1 year for follow-up through a telephone visit.   I discussed the assessment and treatment plan with the patient. The patient was provided an opportunity to ask questions and all were answered. The patient agreed with the plan and demonstrated an understanding of the instructions. The patient was advised to call back or seek an in-person evaluation if the symptoms worsen or if the condition fails to improve as anticipated.   I provided 12 minutes of non-face-to-face time during this encounter.  This includes time for charting and coordination of care   Harriette Ohara, MD   I Gardiner Coins am scribing for Dr. Lindi Adie  I have reviewed the above documentation for accuracy and completeness, and I agree with the above.

## 2021-09-23 NOTE — Assessment & Plan Note (Signed)
03/06/2018:Right lumpectomy: DCIS 0.3 cm cribriform type intermediate grade, LCIS, fibrocystic change, sclerosing adenosis, no evidence of invasive ductal carcinoma, margins negative, ER 95%, PR 80%, HER-2 equivocal 2+ by IHC (these immunostains were done at [facility] on the biopsy and not repeated on the final), T1BN0 stage Ia (based on biopsy showing invasive cancer)  Current treatment:Adjuvant antiestrogen therapystarted 03/21/2018  Anastrozole toxicities:Tolerating it very well without any hot flashes or myalgias. Breast cancer surveillance: 1.Breast exam 09/25/2019: Benign 2.recommended that she get annual mammogramsin February.  Her primary care physician is Dr. Lind Covert phone number [phone]  Return to clinic in 1 year for follow-upthrough a telephone visit.

## 2022-06-08 ENCOUNTER — Telehealth: Payer: Self-pay | Admitting: Hematology and Oncology

## 2022-06-08 NOTE — Telephone Encounter (Signed)
Scheduled appointment per staff message. Left voicemail. 

## 2022-08-10 ENCOUNTER — Other Ambulatory Visit: Payer: Self-pay | Admitting: Hematology and Oncology

## 2022-09-20 ENCOUNTER — Telehealth: Payer: Self-pay | Admitting: Hematology and Oncology

## 2022-09-20 NOTE — Telephone Encounter (Signed)
 Rescheduled appointment per provider PAL. Left voicemail with appointment details.

## 2022-09-23 ENCOUNTER — Inpatient Hospital Stay: Payer: Medicare Other | Admitting: Hematology and Oncology

## 2022-09-23 DIAGNOSIS — Z170 Estrogen receptor positive status [ER+]: Secondary | ICD-10-CM | POA: Diagnosis not present

## 2022-09-23 DIAGNOSIS — C50111 Malignant neoplasm of central portion of right female breast: Secondary | ICD-10-CM | POA: Diagnosis not present

## 2022-09-23 NOTE — Assessment & Plan Note (Signed)
03/06/2018:Right lumpectomy: DCIS 0.3 cm cribriform type intermediate grade, LCIS, fibrocystic change, sclerosing adenosis, no evidence of invasive ductal carcinoma, margins negative, ER 95%, PR 80%, HER-2 equivocal 2+ by IHC (these immunostains were done at [facility] on the biopsy and not repeated on the final), T1BN0 stage Ia (based on biopsy showing invasive cancer)   Current treatment: Adjuvant antiestrogen therapy started 03/21/2018   Anastrozole toxicities: Tolerating it very well without any hot flashes or myalgias.   Breast cancer surveillance: 1.  Breast exam 09/25/2019: Benign 2. recommended that she get annual mammograms in February.    Her primary care physician is Dr. Lysle Rubens phone number [phone] Her husband and daughter had passed away 2 years ago.   Return to clinic in 1 year for follow-up through a telephone visit.

## 2022-09-23 NOTE — Progress Notes (Signed)
HEMATOLOGY-ONCOLOGY TELEPHONE VISIT PROGRESS NOTE  I connected with our patient on 09/23/22 at  9:45 AM EDT by telephone and verified that I am speaking with the correct person using two identifiers.  I discussed the limitations, risks, security and privacy concerns of performing an evaluation and management service by telephone and the availability of in person appointments.  I also discussed with the patient that there may be a patient responsible charge related to this service. The patient expressed understanding and agreed to proceed.   History of Present Illness: Follow-up on anastrozole therapy.  She is tolerating anastrozole fairly well.  She did noticed recently some lightheadedness dizziness which could be related to her change in the blood pressure medications.  Denies any lumps or nodules in the breast.  She tells me that she had a mammogram in [place] and they were normal.  Oncology History  Malignant neoplasm of central portion of right breast in female, estrogen receptor positive ([facility])  03/06/2018 Surgery   Right lumpectomy: DCIS 0.3 cm cribriform type intermediate grade, LCIS, fibrocystic change, sclerosing adenosis, no evidence of invasive ductal carcinoma, margins negative, ER 95%, PR 80%, HER-2 equivocal 2+ by IHC (these immunostains were done at [facility] on the biopsy and not repeated on the final), T1BN0 stage Ia (based on biopsy showing invasive cancer)   03/15/2018 Initial Diagnosis   Screening mammogram detected 9 mm right breast cancer central portion of the right breast, biopsy revealed grade 1 IDC WITH DCIS ER PR positive HER-2 negative [facility] biopsy), T1BN0 stage Ia clinical stage   03/2018 -  Anti-estrogen oral therapy   Anastrozole daily     REVIEW OF SYSTEMS:   Constitutional: Denies fevers, chills or abnormal weight loss All other systems were reviewed with the patient and are negative. Observations/Objective:     Assessment  Plan:  Malignant neoplasm of central portion of right breast in female, estrogen receptor positive ([facility]) 03/06/2018:Right lumpectomy: DCIS 0.3 cm cribriform type intermediate grade, LCIS, fibrocystic change, sclerosing adenosis, no evidence of invasive ductal carcinoma, margins negative, ER 95%, PR 80%, HER-2 equivocal 2+ by IHC (these immunostains were done at [facility] on the biopsy and not repeated on the final), T1BN0 stage Ia (based on biopsy showing invasive cancer)   Current treatment: Adjuvant antiestrogen therapy started 03/21/2018   Anastrozole toxicities: Tolerating it very well without any hot flashes or myalgias. Lightheadedness, dizziness, diarrhea: could be unrelated to anastrozole. Her BP meds have changed to spirolactone and metoprolol.   Breast cancer surveillance: March 2023:  in [place]. She had another mammogram in March 2024.   Her primary care physician is Dr. Lysle Rubens phone number [phone] Her husband and daughter had passed away Oct 04, 2019   Return to clinic on an as-needed basis   I discussed the assessment and treatment plan with the patient. The patient was provided an opportunity to ask questions and all were answered. The patient agreed with the plan and demonstrated an understanding of the instructions. The patient was advised to call back or seek an in-person evaluation if the symptoms worsen or if the condition fails to improve as anticipated.   I provided 12 minutes of non-face-to-face time during this encounter.  This includes time for charting and coordination of care   Tamsen Meek, MD  I Janan Ridge am acting as a scribe for Dr.Raquel Sayres  I have reviewed the above documentation for accuracy and completeness, and I agree with the above.

## 2022-09-24 ENCOUNTER — Telehealth: Payer: Medicare Other | Admitting: Hematology and Oncology

## 2023-08-03 ENCOUNTER — Other Ambulatory Visit: Payer: Self-pay | Admitting: Hematology and Oncology

## 2023-09-19 ENCOUNTER — Inpatient Hospital Stay: Attending: Hematology and Oncology | Admitting: Hematology and Oncology

## 2023-09-19 DIAGNOSIS — Z79811 Long term (current) use of aromatase inhibitors: Secondary | ICD-10-CM | POA: Diagnosis not present

## 2023-09-19 DIAGNOSIS — C50111 Malignant neoplasm of central portion of right female breast: Secondary | ICD-10-CM

## 2023-09-19 DIAGNOSIS — Z170 Estrogen receptor positive status [ER+]: Secondary | ICD-10-CM

## 2023-09-19 NOTE — Assessment & Plan Note (Signed)
 03/06/2018:Right lumpectomy: DCIS 0.3 cm cribriform type intermediate grade, LCIS, fibrocystic change, sclerosing adenosis, no evidence of invasive ductal carcinoma, margins negative, ER 95%, PR 80%, HER-2 equivocal 2+ by IHC (these immunostains were done at [facility] on the biopsy and not repeated on the final), T1BN0 stage Ia (based on biopsy showing invasive cancer)   Current treatment: Adjuvant antiestrogen therapy started 03/21/2018-09/19/2023    Anastrozole  toxicities: Tolerating it very well without any hot flashes or myalgias. Lightheadedness, dizziness, diarrhea: could be unrelated to anastrozole . Her BP meds have changed to spirolactone and metoprolol.   Breast cancer surveillance: March 2023:  in [place]. She had another mammogram in March 2024. I did not recommend any further mammograms   Her primary care physician is Dr. Vina Pepper phone number [phone] Her husband and daughter had passed away Oct 11, 2019   Return to clinic on an as-needed basis

## 2023-09-19 NOTE — Progress Notes (Signed)
 HEMATOLOGY-ONCOLOGY TELEPHONE VISIT PROGRESS NOTE  I connected with our patient on 09/19/23 at 11:00 AM EDT by telephone and verified that I am speaking with the correct person using two identifiers.  I discussed the limitations, risks, security and privacy concerns of performing an evaluation and management service by telephone and the availability of in person appointments.  I also discussed with the patient that there may be a patient responsible charge related to this service. The patient expressed understanding and agreed to proceed.   History of Present Illness: Telephone follow-up on anastrozole  therapy  History of Present Illness Dana Levine is a 89 year old female with breast cancer who presents for follow-up regarding her anastrozole  treatment.  She is currently taking anastrozole  and has started a new bottle containing ninety pills. She has been on this medication for nearly five years and inquires about whether to continue or stop after finishing the current bottle.  She inquires about the necessity of continuing mammograms, noting that her last mammogram was in July of the previous year in [place].    Oncology History  Malignant neoplasm of central portion of right breast in female, estrogen receptor positive ([facility])  03/06/2018 Surgery   Right lumpectomy: DCIS 0.3 cm cribriform type intermediate grade, LCIS, fibrocystic change, sclerosing adenosis, no evidence of invasive ductal carcinoma, margins negative, ER 95%, PR 80%, HER-2 equivocal 2+ by IHC (these immunostains were done at [facility] on the biopsy and not repeated on the final), T1BN0 stage Ia (based on biopsy showing invasive cancer)   03/15/2018 Initial Diagnosis   Screening mammogram detected 9 mm right breast cancer central portion of the right breast, biopsy revealed grade 1 IDC WITH DCIS ER PR positive HER-2 negative [facility] biopsy), T1BN0 stage Ia clinical stage   03/2018 -  Anti-estrogen  oral therapy   Anastrozole  daily     REVIEW OF SYSTEMS:   Constitutional: Denies fevers, chills or abnormal weight loss All other systems were reviewed with the patient and are negative. Observations/Objective:     Assessment Plan:  Malignant neoplasm of central portion of right breast in female, estrogen receptor positive ([facility]) 03/06/2018:Right lumpectomy: DCIS 0.3 cm cribriform type intermediate grade, LCIS, fibrocystic change, sclerosing adenosis, no evidence of invasive ductal carcinoma, margins negative, ER 95%, PR 80%, HER-2 equivocal 2+ by IHC (these immunostains were done at [facility] on the biopsy and not repeated on the final), T1BN0 stage Ia (based on biopsy showing invasive cancer)   Current treatment: Adjuvant antiestrogen therapy started 03/21/2018-09/19/2023    Anastrozole  toxicities: Tolerating it very well without any hot flashes or myalgias. Lightheadedness, dizziness, diarrhea: could be unrelated to anastrozole . BP improved with spirolactone and metoprolol.   Breast cancer surveillance: July 2024:  in [place]. She had another mammogram in March 2024. I did not recommend any further mammograms unless she has symptoms or concerns   Neuropathy:  Her primary care physician is Dr. Vina Pepper phone number [phone] Her husband and daughter had passed away September 30, 2019  Return to clinic on an as-needed basis Assessment & Plan Estrogen receptor positive malignant neoplasm of central portion of right breast, female Nearing completion of five-year anastrozole  course with no recurrence or new symptoms. - Continue anastrozole  until current bottle is finished, then discontinue. - Mammograms optional; no further mammograms unless symptoms arise.      I discussed the assessment and treatment plan with the patient. The patient was provided an opportunity to ask questions and all were answered. The  patient agreed with the plan and demonstrated an understanding of the  instructions. The patient was advised to call back or seek an in-person evaluation if the symptoms worsen or if the condition fails to improve as anticipated.   I provided 20 minutes of non-face-to-face time during this encounter.  This includes time for charting and coordination of care   Naomi MARLA Chad, MD
